# Patient Record
Sex: Female | Born: 1955 | Race: White | Hispanic: No | Smoking: Never smoker
Health system: Southern US, Community
[De-identification: ages and names within clinical notes are randomized; demographics above are authoritative.]

## PROBLEM LIST (undated history)

## (undated) DIAGNOSIS — J45909 Unspecified asthma, uncomplicated: Secondary | ICD-10-CM

## (undated) HISTORY — PX: SINOSCOPY: SHX187

## (undated) HISTORY — DX: Unspecified asthma, uncomplicated: J45.909

---

## 2014-12-08 DIAGNOSIS — Z8709 Personal history of other diseases of the respiratory system: Secondary | ICD-10-CM | POA: Insufficient documentation

## 2014-12-08 DIAGNOSIS — R0600 Dyspnea, unspecified: Secondary | ICD-10-CM

## 2014-12-08 DIAGNOSIS — Z87898 Personal history of other specified conditions: Secondary | ICD-10-CM | POA: Insufficient documentation

## 2014-12-08 DIAGNOSIS — J329 Chronic sinusitis, unspecified: Secondary | ICD-10-CM

## 2014-12-08 DIAGNOSIS — J3089 Other allergic rhinitis: Secondary | ICD-10-CM | POA: Insufficient documentation

## 2014-12-25 ENCOUNTER — Ambulatory Visit (INDEPENDENT_AMBULATORY_CARE_PROVIDER_SITE_OTHER): Payer: BLUE CROSS/BLUE SHIELD

## 2014-12-25 DIAGNOSIS — J329 Chronic sinusitis, unspecified: Secondary | ICD-10-CM

## 2014-12-25 DIAGNOSIS — J310 Chronic rhinitis: Secondary | ICD-10-CM

## 2015-01-01 ENCOUNTER — Ambulatory Visit (INDEPENDENT_AMBULATORY_CARE_PROVIDER_SITE_OTHER): Payer: BLUE CROSS/BLUE SHIELD

## 2015-01-01 DIAGNOSIS — J309 Allergic rhinitis, unspecified: Secondary | ICD-10-CM | POA: Diagnosis not present

## 2015-01-07 ENCOUNTER — Ambulatory Visit (INDEPENDENT_AMBULATORY_CARE_PROVIDER_SITE_OTHER): Payer: BLUE CROSS/BLUE SHIELD | Admitting: Neurology

## 2015-01-07 DIAGNOSIS — J309 Allergic rhinitis, unspecified: Secondary | ICD-10-CM

## 2015-01-15 ENCOUNTER — Ambulatory Visit (INDEPENDENT_AMBULATORY_CARE_PROVIDER_SITE_OTHER): Payer: BLUE CROSS/BLUE SHIELD

## 2015-01-15 DIAGNOSIS — J309 Allergic rhinitis, unspecified: Secondary | ICD-10-CM

## 2015-01-22 ENCOUNTER — Ambulatory Visit (INDEPENDENT_AMBULATORY_CARE_PROVIDER_SITE_OTHER): Payer: BLUE CROSS/BLUE SHIELD

## 2015-01-22 DIAGNOSIS — J309 Allergic rhinitis, unspecified: Secondary | ICD-10-CM | POA: Diagnosis not present

## 2015-01-29 ENCOUNTER — Ambulatory Visit (INDEPENDENT_AMBULATORY_CARE_PROVIDER_SITE_OTHER): Payer: BLUE CROSS/BLUE SHIELD

## 2015-01-29 DIAGNOSIS — J309 Allergic rhinitis, unspecified: Secondary | ICD-10-CM | POA: Diagnosis not present

## 2015-02-05 ENCOUNTER — Ambulatory Visit (INDEPENDENT_AMBULATORY_CARE_PROVIDER_SITE_OTHER): Payer: BLUE CROSS/BLUE SHIELD

## 2015-02-05 DIAGNOSIS — J309 Allergic rhinitis, unspecified: Secondary | ICD-10-CM

## 2015-02-07 DIAGNOSIS — J301 Allergic rhinitis due to pollen: Secondary | ICD-10-CM | POA: Diagnosis not present

## 2015-02-08 DIAGNOSIS — J3089 Other allergic rhinitis: Secondary | ICD-10-CM | POA: Diagnosis not present

## 2015-02-14 ENCOUNTER — Ambulatory Visit (INDEPENDENT_AMBULATORY_CARE_PROVIDER_SITE_OTHER): Payer: BLUE CROSS/BLUE SHIELD

## 2015-02-14 DIAGNOSIS — J309 Allergic rhinitis, unspecified: Secondary | ICD-10-CM

## 2015-02-26 ENCOUNTER — Ambulatory Visit (INDEPENDENT_AMBULATORY_CARE_PROVIDER_SITE_OTHER): Payer: BLUE CROSS/BLUE SHIELD

## 2015-02-26 DIAGNOSIS — J309 Allergic rhinitis, unspecified: Secondary | ICD-10-CM

## 2015-03-05 ENCOUNTER — Ambulatory Visit (INDEPENDENT_AMBULATORY_CARE_PROVIDER_SITE_OTHER): Payer: BLUE CROSS/BLUE SHIELD

## 2015-03-05 DIAGNOSIS — J309 Allergic rhinitis, unspecified: Secondary | ICD-10-CM | POA: Diagnosis not present

## 2015-03-15 ENCOUNTER — Ambulatory Visit (INDEPENDENT_AMBULATORY_CARE_PROVIDER_SITE_OTHER): Payer: BLUE CROSS/BLUE SHIELD | Admitting: *Deleted

## 2015-03-15 DIAGNOSIS — J309 Allergic rhinitis, unspecified: Secondary | ICD-10-CM | POA: Diagnosis not present

## 2015-03-28 ENCOUNTER — Ambulatory Visit (INDEPENDENT_AMBULATORY_CARE_PROVIDER_SITE_OTHER): Payer: BLUE CROSS/BLUE SHIELD

## 2015-03-28 DIAGNOSIS — J309 Allergic rhinitis, unspecified: Secondary | ICD-10-CM | POA: Diagnosis not present

## 2015-04-04 ENCOUNTER — Ambulatory Visit (INDEPENDENT_AMBULATORY_CARE_PROVIDER_SITE_OTHER): Payer: BLUE CROSS/BLUE SHIELD

## 2015-04-04 DIAGNOSIS — J309 Allergic rhinitis, unspecified: Secondary | ICD-10-CM

## 2015-04-16 ENCOUNTER — Ambulatory Visit (INDEPENDENT_AMBULATORY_CARE_PROVIDER_SITE_OTHER): Payer: BLUE CROSS/BLUE SHIELD

## 2015-04-16 DIAGNOSIS — J309 Allergic rhinitis, unspecified: Secondary | ICD-10-CM

## 2015-04-30 ENCOUNTER — Telehealth: Payer: Self-pay

## 2015-04-30 ENCOUNTER — Ambulatory Visit (INDEPENDENT_AMBULATORY_CARE_PROVIDER_SITE_OTHER): Payer: BLUE CROSS/BLUE SHIELD

## 2015-04-30 DIAGNOSIS — J309 Allergic rhinitis, unspecified: Secondary | ICD-10-CM | POA: Diagnosis not present

## 2015-04-30 NOTE — Telephone Encounter (Signed)
She was wondering if you knew a doctor within the [facility] or anywhere in [place] that you would highly recommend that is a ENT who specialies in otolaryngology?

## 2015-04-30 NOTE — Telephone Encounter (Signed)
Dr. Ezzard Standing or Dr. Haroldine Laws with [facility].

## 2015-05-01 NOTE — Telephone Encounter (Signed)
Patient need a physician specializes in sinuses more than an ENT. Patient can go outside from [facility]. Patient is asking you and her PCP.

## 2015-05-01 NOTE — Telephone Encounter (Signed)
Called and left voicemail for patient to return phone call

## 2015-05-01 NOTE — Telephone Encounter (Signed)
Spoke with patient and notified patient.

## 2015-05-01 NOTE — Telephone Encounter (Signed)
ENT's specialize in sinuses.

## 2015-05-14 ENCOUNTER — Ambulatory Visit (INDEPENDENT_AMBULATORY_CARE_PROVIDER_SITE_OTHER): Payer: BLUE CROSS/BLUE SHIELD

## 2015-05-14 DIAGNOSIS — J309 Allergic rhinitis, unspecified: Secondary | ICD-10-CM

## 2015-05-28 ENCOUNTER — Ambulatory Visit (INDEPENDENT_AMBULATORY_CARE_PROVIDER_SITE_OTHER): Payer: BLUE CROSS/BLUE SHIELD

## 2015-05-28 DIAGNOSIS — J309 Allergic rhinitis, unspecified: Secondary | ICD-10-CM | POA: Diagnosis not present

## 2015-05-30 DIAGNOSIS — J301 Allergic rhinitis due to pollen: Secondary | ICD-10-CM | POA: Diagnosis not present

## 2015-05-31 DIAGNOSIS — J3089 Other allergic rhinitis: Secondary | ICD-10-CM | POA: Diagnosis not present

## 2015-06-12 ENCOUNTER — Ambulatory Visit (INDEPENDENT_AMBULATORY_CARE_PROVIDER_SITE_OTHER): Payer: BLUE CROSS/BLUE SHIELD

## 2015-06-12 DIAGNOSIS — J309 Allergic rhinitis, unspecified: Secondary | ICD-10-CM

## 2015-06-25 ENCOUNTER — Ambulatory Visit (INDEPENDENT_AMBULATORY_CARE_PROVIDER_SITE_OTHER): Payer: BLUE CROSS/BLUE SHIELD

## 2015-06-25 DIAGNOSIS — J309 Allergic rhinitis, unspecified: Secondary | ICD-10-CM | POA: Diagnosis not present

## 2015-07-04 ENCOUNTER — Ambulatory Visit (INDEPENDENT_AMBULATORY_CARE_PROVIDER_SITE_OTHER): Payer: BLUE CROSS/BLUE SHIELD

## 2015-07-04 DIAGNOSIS — J309 Allergic rhinitis, unspecified: Secondary | ICD-10-CM | POA: Diagnosis not present

## 2015-07-15 ENCOUNTER — Ambulatory Visit (INDEPENDENT_AMBULATORY_CARE_PROVIDER_SITE_OTHER): Payer: BLUE CROSS/BLUE SHIELD

## 2015-07-15 DIAGNOSIS — J309 Allergic rhinitis, unspecified: Secondary | ICD-10-CM | POA: Diagnosis not present

## 2015-07-23 ENCOUNTER — Ambulatory Visit (INDEPENDENT_AMBULATORY_CARE_PROVIDER_SITE_OTHER): Payer: BLUE CROSS/BLUE SHIELD

## 2015-07-23 DIAGNOSIS — J309 Allergic rhinitis, unspecified: Secondary | ICD-10-CM

## 2015-08-01 ENCOUNTER — Ambulatory Visit (INDEPENDENT_AMBULATORY_CARE_PROVIDER_SITE_OTHER): Payer: BLUE CROSS/BLUE SHIELD

## 2015-08-01 DIAGNOSIS — J309 Allergic rhinitis, unspecified: Secondary | ICD-10-CM

## 2015-08-15 ENCOUNTER — Ambulatory Visit (INDEPENDENT_AMBULATORY_CARE_PROVIDER_SITE_OTHER): Payer: BLUE CROSS/BLUE SHIELD

## 2015-08-15 DIAGNOSIS — J309 Allergic rhinitis, unspecified: Secondary | ICD-10-CM

## 2015-08-27 ENCOUNTER — Ambulatory Visit (INDEPENDENT_AMBULATORY_CARE_PROVIDER_SITE_OTHER): Payer: BLUE CROSS/BLUE SHIELD | Admitting: *Deleted

## 2015-08-27 DIAGNOSIS — J309 Allergic rhinitis, unspecified: Secondary | ICD-10-CM

## 2015-09-18 ENCOUNTER — Ambulatory Visit: Payer: Self-pay | Admitting: *Deleted

## 2015-09-23 ENCOUNTER — Ambulatory Visit (INDEPENDENT_AMBULATORY_CARE_PROVIDER_SITE_OTHER): Payer: BLUE CROSS/BLUE SHIELD | Admitting: *Deleted

## 2015-09-23 DIAGNOSIS — J309 Allergic rhinitis, unspecified: Secondary | ICD-10-CM | POA: Diagnosis not present

## 2015-10-08 DIAGNOSIS — J301 Allergic rhinitis due to pollen: Secondary | ICD-10-CM | POA: Diagnosis not present

## 2015-10-09 DIAGNOSIS — J3089 Other allergic rhinitis: Secondary | ICD-10-CM | POA: Diagnosis not present

## 2015-10-15 ENCOUNTER — Ambulatory Visit (INDEPENDENT_AMBULATORY_CARE_PROVIDER_SITE_OTHER): Payer: BLUE CROSS/BLUE SHIELD | Admitting: *Deleted

## 2015-10-15 DIAGNOSIS — J309 Allergic rhinitis, unspecified: Secondary | ICD-10-CM

## 2015-10-22 ENCOUNTER — Ambulatory Visit (INDEPENDENT_AMBULATORY_CARE_PROVIDER_SITE_OTHER): Payer: BLUE CROSS/BLUE SHIELD | Admitting: Allergy and Immunology

## 2015-10-22 ENCOUNTER — Encounter: Payer: Self-pay | Admitting: Allergy and Immunology

## 2015-10-22 ENCOUNTER — Ambulatory Visit: Payer: Self-pay | Admitting: Allergy and Immunology

## 2015-10-22 VITALS — BP 110/78 | HR 68 | Temp 97.9°F | Resp 16 | Ht 64.0 in | Wt 136.6 lb

## 2015-10-22 DIAGNOSIS — Z8709 Personal history of other diseases of the respiratory system: Secondary | ICD-10-CM | POA: Diagnosis not present

## 2015-10-22 DIAGNOSIS — J3089 Other allergic rhinitis: Secondary | ICD-10-CM

## 2015-10-22 DIAGNOSIS — Z87898 Personal history of other specified conditions: Secondary | ICD-10-CM

## 2015-10-22 MED ORDER — LEVOCETIRIZINE DIHYDROCHLORIDE 5 MG PO TABS: 5.0000 mg | ORAL_TABLET | Freq: Every evening | ORAL | 5 refills | Status: DC

## 2015-10-22 MED ORDER — EPINEPHRINE 0.3 MG/0.3ML IJ SOAJ: INTRAMUSCULAR | 3 refills | Status: DC

## 2015-10-22 NOTE — Assessment & Plan Note (Signed)
   Continue appropriate allergen avoidance measures, aeroallergen immunotherapy as prescribed and as tolerated, and budesonide/saline irrigation as needed.  A prescription has been provided for levocetirizine, 5 mg daily as needed.

## 2015-10-22 NOTE — Progress Notes (Signed)
Follow-up Note  RE: Kirsten Huynh MRN: [id number] DOB: 1956/02/05 Date of Office Visit: 10/22/2015  Primary care provider: Gaspar Skeeters, MD Referring provider: No ref. provider found  History of present illness: Kirsten Huynh is a 60 y.o. female with allergic rhinitis on immunotherapy and history of dyspnea presenting today for follow up.  She was last seen in this clinic in July 2016.  She reports that her nasal/sinus symptoms have improved.  Her only complaint today has to do with a "whistling" noise which occurs when breathing through her nose since her sinus surgery/turbinate reduction in March 2016.  She is tolerating aeroallergen immunotherapy without complications or problems.  She continues to use budesonide/saline sinus irrigation as needed.  She has no lower respiratory symptom complaints today.    Assessment and plan: Allergic rhinitis  Continue appropriate allergen avoidance measures, aeroallergen immunotherapy as prescribed and as tolerated, and budesonide/saline irrigation as needed.  A prescription has been provided for levocetirizine, 5 mg daily as needed.  History of dyspnea Quiescent.  Unless lower respiratory symptoms recur, we will not treat or evaluate further.   Meds ordered this encounter  Medications  . EPINEPHrine (AUVI-Q) 0.3 mg/0.3 mL IJ SOAJ injection    Sig: USE AS DIRECTED FOR LIFE THREATENING ALLERGIC REACTIONS    Dispense:  2 Device    Refill:  3  . levocetirizine (XYZAL) 5 MG tablet    Sig: Take 1 tablet (5 mg total) by mouth every evening.    Dispense:  30 tablet    Refill:  5    Diagnositics: Spirometry:  Normal with an FEV1 of 94% predicted.  Please see scanned spirometry results for details.    Physical examination: Blood pressure 110/78, pulse 68, temperature 97.9 F (36.6 C), temperature source Oral, resp. rate 16, height 5\' 4"  (1.626 m), weight 136 lb 9.6 oz (62 kg), SpO2 98 %.  General: Alert, interactive, in no acute  distress. HEENT: TMs pearly gray, turbinates mildly edematous without discharge, post-pharynx mildly erythematous. Neck: Supple without lymphadenopathy. Lungs: Clear to auscultation without wheezing, rhonchi or rales. CV: Normal S1, S2 without murmurs. Skin: Warm and dry, without lesions or rashes.  The following portions of the patient's history were reviewed and updated as appropriate: allergies, current medications, past family history, past medical history, past social history, past surgical history and problem list.    Medication List       Accurate as of 10/22/15  1:36 PM. Always use your most recent med list.          alendronate 70 MG tablet Commonly known as:  FOSAMAX Take by mouth.   budesonide 0.5 MG/2ML nebulizer solution Commonly known as:  PULMICORT MIX WITH SALINE FOR NASAL RINSE ONCE DAILY IF NEEDED   EPINEPHrine 0.3 mg/0.3 mL Soaj injection Commonly known as:  AUVI-Q USE AS DIRECTED FOR LIFE THREATENING ALLERGIC REACTIONS   GUAIFENESIN 1200 PO Take 1,200 mg by mouth 2 (two) times daily.   levocetirizine 5 MG tablet Commonly known as:  XYZAL Take 1 tablet (5 mg total) by mouth every evening.   NASAL SALINE NA Place into the nose as needed.   PROAIR RESPICLICK 108 (90 Base) MCG/ACT Aepb Generic drug:  Albuterol Sulfate Inhale 2 puffs into the lungs as needed.   Vitamin D (Ergocalciferol) 50000 units Caps capsule Commonly known as:  DRISDOL Take 50,000 Units by mouth once a week.   Vitamin D (Ergocalciferol) 50000 units Caps capsule Commonly known as:  DRISDOL Take by mouth.  Allergies  Allergen Reactions  . Sudafed [Pseudoephedrine Hcl]     I appreciate the opportunity to take part in Rhilee's care. Please do not hesitate to contact me with questions.  Sincerely,   R. Jorene Guest, MD

## 2015-10-22 NOTE — Patient Instructions (Signed)
Allergic rhinitis  Continue appropriate allergen avoidance measures, aeroallergen immunotherapy as prescribed and as tolerated, and budesonide/saline irrigation as needed.  A prescription has been provided for levocetirizine, 5 mg daily as needed.  History of dyspnea Quiescent.  Unless lower respiratory symptoms recur, we will not treat or evaluate further.   Return in about 1 year (around 10/21/2016), or if symptoms worsen or fail to improve.

## 2015-10-22 NOTE — Assessment & Plan Note (Signed)
Quiescent.  Unless lower respiratory symptoms recur, we will not treat or evaluate further. 

## 2015-11-04 ENCOUNTER — Ambulatory Visit (INDEPENDENT_AMBULATORY_CARE_PROVIDER_SITE_OTHER): Payer: BLUE CROSS/BLUE SHIELD | Admitting: *Deleted

## 2015-11-04 DIAGNOSIS — J309 Allergic rhinitis, unspecified: Secondary | ICD-10-CM

## 2015-11-19 ENCOUNTER — Ambulatory Visit (INDEPENDENT_AMBULATORY_CARE_PROVIDER_SITE_OTHER): Payer: BLUE CROSS/BLUE SHIELD | Admitting: *Deleted

## 2015-11-19 DIAGNOSIS — J309 Allergic rhinitis, unspecified: Secondary | ICD-10-CM | POA: Diagnosis not present

## 2015-12-03 ENCOUNTER — Ambulatory Visit (INDEPENDENT_AMBULATORY_CARE_PROVIDER_SITE_OTHER): Payer: BLUE CROSS/BLUE SHIELD

## 2015-12-03 DIAGNOSIS — J309 Allergic rhinitis, unspecified: Secondary | ICD-10-CM

## 2015-12-10 ENCOUNTER — Ambulatory Visit (INDEPENDENT_AMBULATORY_CARE_PROVIDER_SITE_OTHER): Payer: BLUE CROSS/BLUE SHIELD | Admitting: *Deleted

## 2015-12-10 DIAGNOSIS — J309 Allergic rhinitis, unspecified: Secondary | ICD-10-CM | POA: Diagnosis not present

## 2015-12-17 ENCOUNTER — Ambulatory Visit (INDEPENDENT_AMBULATORY_CARE_PROVIDER_SITE_OTHER): Payer: BLUE CROSS/BLUE SHIELD

## 2015-12-17 DIAGNOSIS — J309 Allergic rhinitis, unspecified: Secondary | ICD-10-CM

## 2015-12-24 ENCOUNTER — Ambulatory Visit (INDEPENDENT_AMBULATORY_CARE_PROVIDER_SITE_OTHER): Payer: BLUE CROSS/BLUE SHIELD

## 2015-12-24 DIAGNOSIS — J309 Allergic rhinitis, unspecified: Secondary | ICD-10-CM

## 2015-12-31 ENCOUNTER — Ambulatory Visit (INDEPENDENT_AMBULATORY_CARE_PROVIDER_SITE_OTHER): Payer: BLUE CROSS/BLUE SHIELD

## 2015-12-31 DIAGNOSIS — J309 Allergic rhinitis, unspecified: Secondary | ICD-10-CM

## 2016-01-21 ENCOUNTER — Ambulatory Visit (INDEPENDENT_AMBULATORY_CARE_PROVIDER_SITE_OTHER): Payer: BLUE CROSS/BLUE SHIELD

## 2016-01-21 DIAGNOSIS — J309 Allergic rhinitis, unspecified: Secondary | ICD-10-CM

## 2016-02-17 ENCOUNTER — Ambulatory Visit (INDEPENDENT_AMBULATORY_CARE_PROVIDER_SITE_OTHER): Payer: BLUE CROSS/BLUE SHIELD | Admitting: *Deleted

## 2016-02-17 DIAGNOSIS — J309 Allergic rhinitis, unspecified: Secondary | ICD-10-CM

## 2016-03-10 ENCOUNTER — Ambulatory Visit (INDEPENDENT_AMBULATORY_CARE_PROVIDER_SITE_OTHER): Payer: BLUE CROSS/BLUE SHIELD | Admitting: *Deleted

## 2016-03-10 DIAGNOSIS — J309 Allergic rhinitis, unspecified: Secondary | ICD-10-CM

## 2016-03-31 ENCOUNTER — Ambulatory Visit (INDEPENDENT_AMBULATORY_CARE_PROVIDER_SITE_OTHER): Payer: BLUE CROSS/BLUE SHIELD | Admitting: *Deleted

## 2016-03-31 DIAGNOSIS — J309 Allergic rhinitis, unspecified: Secondary | ICD-10-CM | POA: Diagnosis not present

## 2016-04-09 DIAGNOSIS — J301 Allergic rhinitis due to pollen: Secondary | ICD-10-CM | POA: Diagnosis not present

## 2016-04-10 DIAGNOSIS — J3089 Other allergic rhinitis: Secondary | ICD-10-CM | POA: Diagnosis not present

## 2016-04-17 NOTE — Addendum Note (Signed)
Addended by: Maryjean MornFREEMAN, LOGAN D on: 04/17/2016 01:33 PM   Modules accepted: Orders

## 2016-04-23 ENCOUNTER — Ambulatory Visit (INDEPENDENT_AMBULATORY_CARE_PROVIDER_SITE_OTHER): Payer: BLUE CROSS/BLUE SHIELD

## 2016-04-23 DIAGNOSIS — J309 Allergic rhinitis, unspecified: Secondary | ICD-10-CM

## 2016-05-18 ENCOUNTER — Ambulatory Visit (INDEPENDENT_AMBULATORY_CARE_PROVIDER_SITE_OTHER): Payer: BLUE CROSS/BLUE SHIELD

## 2016-05-18 DIAGNOSIS — J309 Allergic rhinitis, unspecified: Secondary | ICD-10-CM | POA: Diagnosis not present

## 2016-05-26 ENCOUNTER — Ambulatory Visit (INDEPENDENT_AMBULATORY_CARE_PROVIDER_SITE_OTHER): Payer: BLUE CROSS/BLUE SHIELD | Admitting: *Deleted

## 2016-05-26 DIAGNOSIS — J309 Allergic rhinitis, unspecified: Secondary | ICD-10-CM

## 2016-06-02 ENCOUNTER — Ambulatory Visit (INDEPENDENT_AMBULATORY_CARE_PROVIDER_SITE_OTHER): Payer: BLUE CROSS/BLUE SHIELD | Admitting: *Deleted

## 2016-06-02 DIAGNOSIS — J309 Allergic rhinitis, unspecified: Secondary | ICD-10-CM | POA: Diagnosis not present

## 2016-06-09 ENCOUNTER — Ambulatory Visit (INDEPENDENT_AMBULATORY_CARE_PROVIDER_SITE_OTHER): Payer: BLUE CROSS/BLUE SHIELD | Admitting: *Deleted

## 2016-06-09 DIAGNOSIS — J309 Allergic rhinitis, unspecified: Secondary | ICD-10-CM

## 2016-06-17 ENCOUNTER — Ambulatory Visit (INDEPENDENT_AMBULATORY_CARE_PROVIDER_SITE_OTHER): Payer: BLUE CROSS/BLUE SHIELD | Admitting: *Deleted

## 2016-06-17 DIAGNOSIS — J309 Allergic rhinitis, unspecified: Secondary | ICD-10-CM

## 2016-07-14 ENCOUNTER — Ambulatory Visit (INDEPENDENT_AMBULATORY_CARE_PROVIDER_SITE_OTHER): Payer: BLUE CROSS/BLUE SHIELD | Admitting: *Deleted

## 2016-07-14 DIAGNOSIS — J309 Allergic rhinitis, unspecified: Secondary | ICD-10-CM

## 2016-08-06 ENCOUNTER — Ambulatory Visit (INDEPENDENT_AMBULATORY_CARE_PROVIDER_SITE_OTHER): Payer: BLUE CROSS/BLUE SHIELD | Admitting: *Deleted

## 2016-08-06 DIAGNOSIS — J309 Allergic rhinitis, unspecified: Secondary | ICD-10-CM | POA: Diagnosis not present

## 2016-08-25 ENCOUNTER — Ambulatory Visit (INDEPENDENT_AMBULATORY_CARE_PROVIDER_SITE_OTHER): Payer: BLUE CROSS/BLUE SHIELD

## 2016-08-25 DIAGNOSIS — J309 Allergic rhinitis, unspecified: Secondary | ICD-10-CM | POA: Diagnosis not present

## 2016-08-28 DIAGNOSIS — J3089 Other allergic rhinitis: Secondary | ICD-10-CM | POA: Diagnosis not present

## 2016-09-17 ENCOUNTER — Ambulatory Visit (INDEPENDENT_AMBULATORY_CARE_PROVIDER_SITE_OTHER): Payer: BLUE CROSS/BLUE SHIELD

## 2016-09-17 DIAGNOSIS — J309 Allergic rhinitis, unspecified: Secondary | ICD-10-CM

## 2016-10-14 ENCOUNTER — Ambulatory Visit (INDEPENDENT_AMBULATORY_CARE_PROVIDER_SITE_OTHER): Payer: BLUE CROSS/BLUE SHIELD

## 2016-10-14 DIAGNOSIS — J309 Allergic rhinitis, unspecified: Secondary | ICD-10-CM

## 2016-10-20 ENCOUNTER — Ambulatory Visit (INDEPENDENT_AMBULATORY_CARE_PROVIDER_SITE_OTHER): Payer: BLUE CROSS/BLUE SHIELD | Admitting: Allergy and Immunology

## 2016-10-20 ENCOUNTER — Encounter: Payer: Self-pay | Admitting: Allergy and Immunology

## 2016-10-20 VITALS — BP 98/54 | HR 60 | Temp 97.7°F | Resp 16

## 2016-10-20 DIAGNOSIS — J3089 Other allergic rhinitis: Secondary | ICD-10-CM

## 2016-10-20 DIAGNOSIS — Z8709 Personal history of other diseases of the respiratory system: Secondary | ICD-10-CM | POA: Diagnosis not present

## 2016-10-20 DIAGNOSIS — Z87898 Personal history of other specified conditions: Secondary | ICD-10-CM

## 2016-10-20 DIAGNOSIS — J321 Chronic frontal sinusitis: Secondary | ICD-10-CM | POA: Diagnosis not present

## 2016-10-20 DIAGNOSIS — J329 Chronic sinusitis, unspecified: Secondary | ICD-10-CM | POA: Insufficient documentation

## 2016-10-20 MED ORDER — EPINEPHRINE 0.3 MG/0.3ML IJ SOAJ: INTRAMUSCULAR | 1 refills | Status: DC

## 2016-10-20 MED ORDER — IPRATROPIUM BROMIDE 0.06 % NA SOLN: 2.0000 | Freq: Three times a day (TID) | NASAL | 12 refills | Status: DC | PRN

## 2016-10-20 NOTE — Progress Notes (Signed)
Follow-up Note  RE: Kirsten BoatmanRhonda Huynh MRN: [id number] DOB: 03-18-1956 Date of Office Visit: 10/20/2016  Primary care provider: Gaspar SkeetersStambaugh, Merris, MD Referring provider: Gaspar SkeetersStambaugh, Merris, MD  History of present illness: Kirsten BoatmanRhonda Huynh is a 61 y.o. female with allergic rhinitis and history of dyspnea presenting today for follow up.  She was last seen in this clinic in July 2017.  Despite compliance with budesonide saline rinses twice a day, guaifenesin, and aeroallergen immunotherapy, she still complains of thick postnasal drainage, nasal congestion, and persistent mild/moderate sinus pressure.  She has tried and failed azelastine nasal spray and fluticasone nasal spray in the past.  She states that she did not experienced symptom reduction from sinus surgery in March 2016 and believes that her symptoms may have progressed due to scar tissue.  She is tolerating aeroallergen immunotherapy injections without problems or complications. She does not experience wheezing or chest tightness.   Assessment and plan: Chronic sinusitis  Continue budesonide saline irrigation twice a day.  A prescription has been provided for ipratropium 0.06% nasal spray every 8 hours as needed.  A prescription has been provided for RyVent (carbinoxamine maleate) 6mg  every 6-8 hours as needed.  Referral to Dr. Suszanne Connerseoh, otolaryngology, has been provided for a second opinion.  Allergic rhinitis  Continue appropriate allergen avoidance measures and aeroallergen immunotherapy as prescribed and as tolerated.  Treatment plan as outlined above for chronic sinusitis.  History of dyspnea Quiescent.  Unless lower respiratory symptoms recur, we will not treat or evaluate further.   Meds ordered this encounter  Medications  . EPINEPHrine (AUVI-Q) 0.3 mg/0.3 mL IJ SOAJ injection    Sig: USE AS DIRECTED FOR LIFE THREATENING ALLERGIC REACTIONS    Dispense:  2 Device    Refill:  1    [phone]  . ipratropium (ATROVENT) 0.06  % nasal spray    Sig: Place 2 sprays into both nostrils every 8 (eight) hours as needed for rhinitis.    Dispense:  15 mL    Refill:  12    Physical examination: Blood pressure (!) 98/54, pulse 60, temperature 97.7 F (36.5 C), temperature source Oral, resp. rate 16, SpO2 98 %.  General: Alert, interactive, in no acute distress. HEENT: TMs pearly gray, turbinates edematous with thick discharge, post-pharynx erythematous. Neck: Supple without lymphadenopathy. Lungs: Clear to auscultation without wheezing, rhonchi or rales. CV: Normal S1, S2 without murmurs. Skin: Warm and dry, without lesions or rashes.  The following portions of the patient's history were reviewed and updated as appropriate: allergies, current medications, past family history, past medical history, past social history, past surgical history and problem list.   Allergies as of 10/20/2016      Reactions   Sudafed [pseudoephedrine Hcl]       Medication List       Accurate as of 10/20/16  1:07 PM. Always use your most recent med list.          alendronate 70 MG tablet Commonly known as:  FOSAMAX Take by mouth.   budesonide 0.5 MG/2ML nebulizer solution Commonly known as:  PULMICORT MIX WITH SALINE FOR NASAL RINSE ONCE DAILY IF NEEDED   EPINEPHrine 0.3 mg/0.3 mL Soaj injection Commonly known as:  AUVI-Q USE AS DIRECTED FOR LIFE THREATENING ALLERGIC REACTIONS   GUAIFENESIN 1200 PO Take 1,200 mg by mouth 2 (two) times daily.   ipratropium 0.06 % nasal spray Commonly known as:  ATROVENT Place 2 sprays into both nostrils every 8 (eight) hours as needed for rhinitis.  levocetirizine 5 MG tablet Commonly known as:  XYZAL Take 1 tablet (5 mg total) by mouth every evening.   NASAL SALINE NA Place into the nose as needed.   PROAIR RESPICLICK 108 (90 Base) MCG/ACT Aepb Generic drug:  Albuterol Sulfate Inhale 2 puffs into the lungs as needed.   vitamin C 500 MG tablet Commonly known as:  ASCORBIC  ACID Take 500 mg by mouth daily.   Vitamin D (Ergocalciferol) 50000 units Caps capsule Commonly known as:  DRISDOL Take 50,000 Units by mouth once a week.   Vitamin D (Ergocalciferol) 50000 units Caps capsule Commonly known as:  DRISDOL Take by mouth.       Allergies  Allergen Reactions  . Sudafed [Pseudoephedrine Hcl]    Review of systems: Review of systems negative except as noted in HPI / PMHx or noted below: Constitutional: Negative.  HENT: Negative.   Eyes: Negative.  Respiratory: Negative.   Cardiovascular: Negative.  Gastrointestinal: Negative.  Genitourinary: Negative.  Musculoskeletal: Negative.  Neurological: Negative.  Endo/Heme/Allergies: Negative.  Cutaneous: Negative.  Past Medical History:  Diagnosis Date  . Asthma     Family History  Problem Relation Age of Onset  . Asthma Mother   . Allergic rhinitis Mother   . Allergic rhinitis Father   . Asthma Sister   . Allergic rhinitis Sister   . Asthma Brother   . Allergic rhinitis Brother   . Angioedema Neg Hx   . Eczema Neg Hx   . Immunodeficiency Neg Hx   . Urticaria Neg Hx     Social History   Social History  . Marital status: Unknown    Spouse name: N/A  . Number of children: N/A  . Years of education: N/A   Occupational History  . Not on file.   Social History Main Topics  . Smoking status: Never Smoker  . Smokeless tobacco: Never Used  . Alcohol use No  . Drug use: No  . Sexual activity: Not on file   Other Topics Concern  . Not on file   Social History Narrative  . No narrative on file    I appreciate the opportunity to take part in Kirsten Huynh's care. Please do not hesitate to contact me with questions.  Sincerely,   R. Jorene Guest, MD

## 2016-10-20 NOTE — Assessment & Plan Note (Signed)
   Continue budesonide saline irrigation twice a day.  A prescription has been provided for ipratropium 0.06% nasal spray every 8 hours as needed.  A prescription has been provided for RyVent (carbinoxamine maleate) 6mg  every 6-8 hours as needed.  Referral to Dr. Suszanne Connerseoh, otolaryngology, has been provided for a second opinion.

## 2016-10-20 NOTE — Assessment & Plan Note (Signed)
   Continue appropriate allergen avoidance measures and aeroallergen immunotherapy as prescribed and as tolerated.  Treatment plan as outlined above for chronic sinusitis.

## 2016-10-20 NOTE — Assessment & Plan Note (Signed)
Quiescent.  Unless lower respiratory symptoms recur, we will not treat or evaluate further. 

## 2016-10-20 NOTE — Patient Instructions (Addendum)
Chronic sinusitis  Continue budesonide saline irrigation twice a day.  A prescription has been provided for ipratropium 0.06% nasal spray every 8 hours as needed.  A prescription has been provided for RyVent (carbinoxamine maleate) 6mg  every 6-8 hours as needed.  Referral to Dr. Suszanne Connerseoh, otolaryngology, has been provided for a second opinion.  Allergic rhinitis  Continue appropriate allergen avoidance measures and aeroallergen immunotherapy as prescribed and as tolerated.  Treatment plan as outlined above for chronic sinusitis.  History of dyspnea Quiescent.  Unless lower respiratory symptoms recur, we will not treat or evaluate further.   Return in about 6 months (around 04/22/2017), or if symptoms worsen or fail to improve.

## 2016-10-27 ENCOUNTER — Telehealth: Payer: Self-pay

## 2016-10-27 NOTE — Telephone Encounter (Signed)
-----   Message from Cristal Fordalph Carter Bobbitt, MD sent at 10/21/2016 11:48 AM EDT ----- Chronic sinusitis.  ----- Message ----- From: Agapito GamesNeal-Mims, Charvis Lightner A, NT Sent: 10/21/2016   9:21 AM To: Cristal Fordalph Carter Bobbitt, MD  Hey,  What is the patients Diagnosis for Dr. Suszanne Connerseoh.  Thanks  ----- Message ----- From: Cristal FordBobbitt, Ralph Carter, MD Sent: 10/20/2016  12:11 PM To: Marin Robertsyasia A Neal-Mims, NT  Please refer to Dr. Suszanne Connerseoh, otolaryngology. She has [facility] an ENT [facility] but wants a second opinion. 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 Thanks

## 2016-10-27 NOTE — Telephone Encounter (Signed)
Marylen PontoMarie Faxed the referral to Dr. Luther Hearingeohs office.   Will follow up with Dr. Luther Hearingeohs office to see if they have scheduled the patient.  Thanks

## 2016-10-28 ENCOUNTER — Other Ambulatory Visit (HOSPITAL_COMMUNITY): Payer: Self-pay | Admitting: Family Medicine

## 2016-10-28 DIAGNOSIS — J321 Chronic frontal sinusitis: Secondary | ICD-10-CM

## 2016-11-03 ENCOUNTER — Ambulatory Visit (HOSPITAL_COMMUNITY)
Admission: RE | Admit: 2016-11-03 | Discharge: 2016-11-03 | Disposition: A | Discharge status: Home / Self Care | Payer: BLUE CROSS/BLUE SHIELD | Source: Ambulatory Visit | Attending: Family Medicine | Admitting: Family Medicine

## 2016-11-03 ENCOUNTER — Ambulatory Visit (INDEPENDENT_AMBULATORY_CARE_PROVIDER_SITE_OTHER): Payer: BLUE CROSS/BLUE SHIELD | Admitting: *Deleted

## 2016-11-03 DIAGNOSIS — J321 Chronic frontal sinusitis: Secondary | ICD-10-CM | POA: Diagnosis present

## 2016-11-03 DIAGNOSIS — Z9889 Other specified postprocedural states: Secondary | ICD-10-CM | POA: Diagnosis not present

## 2016-11-03 DIAGNOSIS — J309 Allergic rhinitis, unspecified: Secondary | ICD-10-CM | POA: Diagnosis not present

## 2016-11-03 DIAGNOSIS — J3489 Other specified disorders of nose and nasal sinuses: Secondary | ICD-10-CM | POA: Insufficient documentation

## 2016-11-09 ENCOUNTER — Ambulatory Visit (INDEPENDENT_AMBULATORY_CARE_PROVIDER_SITE_OTHER): Payer: BLUE CROSS/BLUE SHIELD

## 2016-11-09 DIAGNOSIS — J309 Allergic rhinitis, unspecified: Secondary | ICD-10-CM | POA: Diagnosis not present

## 2016-11-17 ENCOUNTER — Ambulatory Visit (INDEPENDENT_AMBULATORY_CARE_PROVIDER_SITE_OTHER): Payer: BLUE CROSS/BLUE SHIELD | Admitting: *Deleted

## 2016-11-17 DIAGNOSIS — J309 Allergic rhinitis, unspecified: Secondary | ICD-10-CM

## 2016-11-24 ENCOUNTER — Ambulatory Visit (INDEPENDENT_AMBULATORY_CARE_PROVIDER_SITE_OTHER): Payer: BLUE CROSS/BLUE SHIELD | Admitting: *Deleted

## 2016-11-24 DIAGNOSIS — J309 Allergic rhinitis, unspecified: Secondary | ICD-10-CM | POA: Diagnosis not present

## 2016-12-03 ENCOUNTER — Ambulatory Visit (INDEPENDENT_AMBULATORY_CARE_PROVIDER_SITE_OTHER): Payer: BLUE CROSS/BLUE SHIELD | Admitting: *Deleted

## 2016-12-03 DIAGNOSIS — J309 Allergic rhinitis, unspecified: Secondary | ICD-10-CM

## 2017-01-01 ENCOUNTER — Ambulatory Visit (INDEPENDENT_AMBULATORY_CARE_PROVIDER_SITE_OTHER): Payer: BLUE CROSS/BLUE SHIELD

## 2017-01-01 DIAGNOSIS — J309 Allergic rhinitis, unspecified: Secondary | ICD-10-CM | POA: Diagnosis not present

## 2017-02-02 ENCOUNTER — Ambulatory Visit (INDEPENDENT_AMBULATORY_CARE_PROVIDER_SITE_OTHER): Payer: BLUE CROSS/BLUE SHIELD

## 2017-02-02 DIAGNOSIS — J309 Allergic rhinitis, unspecified: Secondary | ICD-10-CM

## 2017-03-04 ENCOUNTER — Ambulatory Visit (INDEPENDENT_AMBULATORY_CARE_PROVIDER_SITE_OTHER): Payer: BLUE CROSS/BLUE SHIELD | Admitting: Allergy

## 2017-03-04 DIAGNOSIS — J309 Allergic rhinitis, unspecified: Secondary | ICD-10-CM

## 2017-03-17 ENCOUNTER — Encounter: Payer: Self-pay | Admitting: *Deleted

## 2017-03-17 DIAGNOSIS — J3089 Other allergic rhinitis: Secondary | ICD-10-CM | POA: Diagnosis not present

## 2017-03-17 NOTE — Progress Notes (Signed)
VIALS MADE. EXP: 03-17-18. HV 

## 2017-03-31 ENCOUNTER — Ambulatory Visit (INDEPENDENT_AMBULATORY_CARE_PROVIDER_SITE_OTHER): Payer: BLUE CROSS/BLUE SHIELD | Admitting: *Deleted

## 2017-03-31 DIAGNOSIS — J309 Allergic rhinitis, unspecified: Secondary | ICD-10-CM

## 2017-05-04 ENCOUNTER — Ambulatory Visit (INDEPENDENT_AMBULATORY_CARE_PROVIDER_SITE_OTHER): Payer: BLUE CROSS/BLUE SHIELD | Admitting: *Deleted

## 2017-05-04 DIAGNOSIS — J309 Allergic rhinitis, unspecified: Secondary | ICD-10-CM | POA: Diagnosis not present

## 2017-06-01 ENCOUNTER — Ambulatory Visit (INDEPENDENT_AMBULATORY_CARE_PROVIDER_SITE_OTHER): Payer: BLUE CROSS/BLUE SHIELD | Admitting: *Deleted

## 2017-06-01 DIAGNOSIS — J309 Allergic rhinitis, unspecified: Secondary | ICD-10-CM

## 2017-06-29 ENCOUNTER — Ambulatory Visit (INDEPENDENT_AMBULATORY_CARE_PROVIDER_SITE_OTHER): Payer: BLUE CROSS/BLUE SHIELD | Admitting: *Deleted

## 2017-06-29 DIAGNOSIS — J309 Allergic rhinitis, unspecified: Secondary | ICD-10-CM | POA: Diagnosis not present

## 2017-07-06 ENCOUNTER — Ambulatory Visit (INDEPENDENT_AMBULATORY_CARE_PROVIDER_SITE_OTHER): Payer: BLUE CROSS/BLUE SHIELD | Admitting: *Deleted

## 2017-07-06 DIAGNOSIS — J309 Allergic rhinitis, unspecified: Secondary | ICD-10-CM

## 2017-07-15 ENCOUNTER — Ambulatory Visit (INDEPENDENT_AMBULATORY_CARE_PROVIDER_SITE_OTHER): Payer: BLUE CROSS/BLUE SHIELD | Admitting: *Deleted

## 2017-07-15 DIAGNOSIS — J309 Allergic rhinitis, unspecified: Secondary | ICD-10-CM | POA: Diagnosis not present

## 2017-07-27 ENCOUNTER — Ambulatory Visit (INDEPENDENT_AMBULATORY_CARE_PROVIDER_SITE_OTHER): Payer: BLUE CROSS/BLUE SHIELD | Admitting: *Deleted

## 2017-07-27 DIAGNOSIS — J309 Allergic rhinitis, unspecified: Secondary | ICD-10-CM | POA: Diagnosis not present

## 2017-08-03 ENCOUNTER — Ambulatory Visit (INDEPENDENT_AMBULATORY_CARE_PROVIDER_SITE_OTHER): Payer: BLUE CROSS/BLUE SHIELD | Admitting: *Deleted

## 2017-08-03 DIAGNOSIS — J309 Allergic rhinitis, unspecified: Secondary | ICD-10-CM

## 2017-09-03 ENCOUNTER — Ambulatory Visit (INDEPENDENT_AMBULATORY_CARE_PROVIDER_SITE_OTHER): Payer: BLUE CROSS/BLUE SHIELD

## 2017-09-03 DIAGNOSIS — J309 Allergic rhinitis, unspecified: Secondary | ICD-10-CM | POA: Diagnosis not present

## 2017-10-05 ENCOUNTER — Ambulatory Visit (INDEPENDENT_AMBULATORY_CARE_PROVIDER_SITE_OTHER): Payer: BLUE CROSS/BLUE SHIELD | Admitting: *Deleted

## 2017-10-05 DIAGNOSIS — J309 Allergic rhinitis, unspecified: Secondary | ICD-10-CM | POA: Diagnosis not present

## 2017-10-12 ENCOUNTER — Encounter: Payer: Self-pay | Admitting: Allergy and Immunology

## 2017-10-12 ENCOUNTER — Ambulatory Visit (INDEPENDENT_AMBULATORY_CARE_PROVIDER_SITE_OTHER): Payer: BLUE CROSS/BLUE SHIELD | Admitting: Allergy and Immunology

## 2017-10-12 DIAGNOSIS — Z8709 Personal history of other diseases of the respiratory system: Secondary | ICD-10-CM

## 2017-10-12 DIAGNOSIS — J321 Chronic frontal sinusitis: Secondary | ICD-10-CM | POA: Diagnosis not present

## 2017-10-12 DIAGNOSIS — J3089 Other allergic rhinitis: Secondary | ICD-10-CM | POA: Diagnosis not present

## 2017-10-12 DIAGNOSIS — Z87898 Personal history of other specified conditions: Secondary | ICD-10-CM

## 2017-10-12 MED ORDER — EPINEPHRINE 0.3 MG/0.3ML IJ SOAJ: INTRAMUSCULAR | 1 refills | Status: DC

## 2017-10-12 NOTE — Assessment & Plan Note (Deleted)
   Treatment plan as outlined above for allergic rhinitis.  Follow-up with otolaryngologist if/when needed.

## 2017-10-12 NOTE — Progress Notes (Signed)
    Follow-up Note  RE: Nida BoatmanRhonda Kloehn MRN: [id number] DOB: [birth year] Date of Office Visit: 10/12/2017  Primary care provider: Gaspar SkeetersStambaugh, Merris, MD Referring provider: Gaspar SkeetersStambaugh, Merris, MD  History of present illness: Nida BoatmanRhonda Santa is a 62 y.o. female with allergic rhinitis presenting today for follow-up.  She was last seen in this clinic in July 2018.  She still experiences sinus pressure and ear pressure by compliance with prescribed medications and immunotherapy injections.  There has been some decrease in the frequency of sinus infections since starting immunotherapy injections.  She follows periodically with otolaryngologists.  Sinus surgery has been proposed, however she is not interested in pursuing surgical options at this time.  Assessment and plan: Allergic rhinitis  Continue appropriate allergen avoidance measures and aeroallergen immunotherapy as prescribed and as tolerated.  Continue budesonide saline irrigation twice a day and ipratropium 0.06% nasal spray every 8 hours if needed.  Chronic sinusitis  Treatment plan as outlined above for allergic rhinitis.  Follow-up with otolaryngologist if/when needed.  History of dyspnea Quiescent.  Unless lower respiratory symptoms recur, we will not treat or evaluate further.   Meds ordered this encounter  Medications  . EPINEPHrine (AUVI-Q) 0.3 mg/0.3 mL IJ SOAJ injection    Sig: USE AS DIRECTED FOR LIFE THREATENING ALLERGIC REACTIONS    Dispense:  2 Device    Refill:  1    [id number]    Physical examination: Blood pressure 100/62, pulse 70, temperature 98 F (36.7 C), temperature source Oral, resp. rate 20, height 5' 4.1" (1.628 m), weight 136 lb (61.7 kg), SpO2 94 %.  General: Alert, interactive, in no acute distress. HEENT: TMs pearly gray, turbinates moderately edematous without discharge, post-pharynx mildly erythematous. Neck: Supple without lymphadenopathy. Lungs: Clear to auscultation without wheezing, rhonchi  or rales. CV: Normal S1, S2 without murmurs. Skin: Warm and dry, without lesions or rashes.  The following portions of the patient's history were reviewed and updated as appropriate: allergies, current medications, past family history, past medical history, past social history, past surgical history and problem list.  Allergies as of 10/12/2017      Reactions   Sudafed [pseudoephedrine Hcl]       Medication List        Accurate as of 10/12/17  6:02 PM. Always use your most recent med list.          alendronate 70 MG tablet Commonly known as:  FOSAMAX Take by mouth.   budesonide 0.5 MG/2ML nebulizer solution Commonly known as:  PULMICORT MIX WITH SALINE FOR NASAL RINSE ONCE DAILY IF NEEDED   EPINEPHrine 0.3 mg/0.3 mL Soaj injection Commonly known as:  AUVI-Q USE AS DIRECTED FOR LIFE THREATENING ALLERGIC REACTIONS   GUAIFENESIN 1200 PO Take 1,200 mg by mouth 2 (two) times daily.   NASAL SALINE NA Place into the nose as needed.   vitamin C 500 MG tablet Commonly known as:  ASCORBIC ACID Take 500 mg by mouth daily.       Allergies  Allergen Reactions  . Sudafed [Pseudoephedrine Hcl]     I appreciate the opportunity to take part in Jahayra's care. Please do not hesitate to contact me with questions.  Sincerely,   R. Jorene Guestarter Delshon Blanchfield, MD

## 2017-10-12 NOTE — Patient Instructions (Addendum)
Allergic rhinitis  Continue appropriate allergen avoidance measures and aeroallergen immunotherapy as prescribed and as tolerated.  Continue budesonide saline irrigation twice a day and ipratropium 0.06% nasal spray every 8 hours if needed.  Chronic sinusitis  Treatment plan as outlined above for allergic rhinitis.  Follow-up with otolaryngologist if/when needed.  History of dyspnea Quiescent.  Unless lower respiratory symptoms recur, we will not treat or evaluate further.   Return in about 1 year (around 10/13/2018), or if symptoms worsen or fail to improve.

## 2017-10-12 NOTE — Assessment & Plan Note (Signed)
Quiescent.  Unless lower respiratory symptoms recur, we will not treat or evaluate further. 

## 2017-10-12 NOTE — Assessment & Plan Note (Signed)
   Treatment plan as outlined above for allergic rhinitis.  Follow-up with otolaryngologist if/when needed. 

## 2017-10-12 NOTE — Assessment & Plan Note (Signed)
   Continue appropriate allergen avoidance measures and aeroallergen immunotherapy as prescribed and as tolerated.  Continue budesonide saline irrigation twice a day and ipratropium 0.06% nasal spray every 8 hours if needed.

## 2017-10-12 NOTE — Assessment & Plan Note (Deleted)
Quiescent.  Unless lower respiratory symptoms recur, we will not treat or evaluate further. 

## 2017-11-02 ENCOUNTER — Ambulatory Visit (INDEPENDENT_AMBULATORY_CARE_PROVIDER_SITE_OTHER): Payer: BLUE CROSS/BLUE SHIELD | Admitting: *Deleted

## 2017-11-02 DIAGNOSIS — J309 Allergic rhinitis, unspecified: Secondary | ICD-10-CM

## 2017-11-05 ENCOUNTER — Encounter: Payer: Self-pay | Admitting: *Deleted

## 2017-11-10 DIAGNOSIS — J3089 Other allergic rhinitis: Secondary | ICD-10-CM

## 2017-12-02 ENCOUNTER — Ambulatory Visit (INDEPENDENT_AMBULATORY_CARE_PROVIDER_SITE_OTHER): Payer: BLUE CROSS/BLUE SHIELD | Admitting: *Deleted

## 2017-12-02 DIAGNOSIS — J309 Allergic rhinitis, unspecified: Secondary | ICD-10-CM | POA: Diagnosis not present

## 2018-01-04 ENCOUNTER — Ambulatory Visit (INDEPENDENT_AMBULATORY_CARE_PROVIDER_SITE_OTHER): Payer: BLUE CROSS/BLUE SHIELD | Admitting: *Deleted

## 2018-01-04 DIAGNOSIS — J309 Allergic rhinitis, unspecified: Secondary | ICD-10-CM

## 2018-02-03 ENCOUNTER — Ambulatory Visit (INDEPENDENT_AMBULATORY_CARE_PROVIDER_SITE_OTHER): Payer: BLUE CROSS/BLUE SHIELD | Admitting: *Deleted

## 2018-02-03 DIAGNOSIS — J309 Allergic rhinitis, unspecified: Secondary | ICD-10-CM

## 2018-02-10 ENCOUNTER — Ambulatory Visit (INDEPENDENT_AMBULATORY_CARE_PROVIDER_SITE_OTHER): Payer: BLUE CROSS/BLUE SHIELD | Admitting: *Deleted

## 2018-02-10 DIAGNOSIS — J309 Allergic rhinitis, unspecified: Secondary | ICD-10-CM | POA: Diagnosis not present

## 2018-02-17 ENCOUNTER — Ambulatory Visit (INDEPENDENT_AMBULATORY_CARE_PROVIDER_SITE_OTHER): Payer: BLUE CROSS/BLUE SHIELD | Admitting: *Deleted

## 2018-02-17 DIAGNOSIS — J309 Allergic rhinitis, unspecified: Secondary | ICD-10-CM

## 2018-02-23 ENCOUNTER — Ambulatory Visit (INDEPENDENT_AMBULATORY_CARE_PROVIDER_SITE_OTHER): Payer: BLUE CROSS/BLUE SHIELD | Admitting: *Deleted

## 2018-02-23 DIAGNOSIS — J309 Allergic rhinitis, unspecified: Secondary | ICD-10-CM

## 2018-03-08 ENCOUNTER — Ambulatory Visit (INDEPENDENT_AMBULATORY_CARE_PROVIDER_SITE_OTHER): Payer: BLUE CROSS/BLUE SHIELD | Admitting: *Deleted

## 2018-03-08 DIAGNOSIS — J309 Allergic rhinitis, unspecified: Secondary | ICD-10-CM | POA: Diagnosis not present

## 2018-04-07 ENCOUNTER — Ambulatory Visit (INDEPENDENT_AMBULATORY_CARE_PROVIDER_SITE_OTHER): Payer: BLUE CROSS/BLUE SHIELD | Admitting: *Deleted

## 2018-04-07 DIAGNOSIS — J309 Allergic rhinitis, unspecified: Secondary | ICD-10-CM | POA: Diagnosis not present

## 2018-05-06 ENCOUNTER — Ambulatory Visit (INDEPENDENT_AMBULATORY_CARE_PROVIDER_SITE_OTHER): Payer: BLUE CROSS/BLUE SHIELD

## 2018-05-06 DIAGNOSIS — J309 Allergic rhinitis, unspecified: Secondary | ICD-10-CM

## 2018-06-09 ENCOUNTER — Ambulatory Visit (INDEPENDENT_AMBULATORY_CARE_PROVIDER_SITE_OTHER): Payer: BLUE CROSS/BLUE SHIELD | Admitting: *Deleted

## 2018-06-09 DIAGNOSIS — J309 Allergic rhinitis, unspecified: Secondary | ICD-10-CM

## 2018-07-14 ENCOUNTER — Ambulatory Visit (INDEPENDENT_AMBULATORY_CARE_PROVIDER_SITE_OTHER): Payer: BLUE CROSS/BLUE SHIELD | Admitting: *Deleted

## 2018-07-14 DIAGNOSIS — J309 Allergic rhinitis, unspecified: Secondary | ICD-10-CM

## 2018-08-10 NOTE — Progress Notes (Signed)
VIALS EXP 08-10-2019 

## 2018-08-11 DIAGNOSIS — J3089 Other allergic rhinitis: Secondary | ICD-10-CM

## 2018-08-24 ENCOUNTER — Ambulatory Visit (INDEPENDENT_AMBULATORY_CARE_PROVIDER_SITE_OTHER): Payer: BLUE CROSS/BLUE SHIELD | Admitting: *Deleted

## 2018-08-24 DIAGNOSIS — J309 Allergic rhinitis, unspecified: Secondary | ICD-10-CM

## 2018-10-04 ENCOUNTER — Ambulatory Visit (INDEPENDENT_AMBULATORY_CARE_PROVIDER_SITE_OTHER): Payer: BLUE CROSS/BLUE SHIELD | Admitting: *Deleted

## 2018-10-04 DIAGNOSIS — J309 Allergic rhinitis, unspecified: Secondary | ICD-10-CM

## 2018-10-31 ENCOUNTER — Ambulatory Visit: Payer: BLUE CROSS/BLUE SHIELD | Admitting: Allergy and Immunology

## 2018-11-07 ENCOUNTER — Telehealth: Payer: Self-pay | Admitting: *Deleted

## 2018-11-07 ENCOUNTER — Ambulatory Visit: Payer: BC Managed Care – PPO | Admitting: Allergy and Immunology

## 2018-11-07 ENCOUNTER — Other Ambulatory Visit: Payer: Self-pay

## 2018-11-07 ENCOUNTER — Encounter: Payer: Self-pay | Admitting: Allergy and Immunology

## 2018-11-07 VITALS — BP 128/82 | HR 72 | Temp 98.0°F | Resp 16 | Ht 64.0 in | Wt 134.6 lb

## 2018-11-07 DIAGNOSIS — S40262D Insect bite (nonvenomous) of left shoulder, subsequent encounter: Secondary | ICD-10-CM

## 2018-11-07 DIAGNOSIS — J3089 Other allergic rhinitis: Secondary | ICD-10-CM | POA: Diagnosis not present

## 2018-11-07 DIAGNOSIS — W57XXXD Bitten or stung by nonvenomous insect and other nonvenomous arthropods, subsequent encounter: Secondary | ICD-10-CM | POA: Diagnosis not present

## 2018-11-07 DIAGNOSIS — W57XXXA Bitten or stung by nonvenomous insect and other nonvenomous arthropods, initial encounter: Secondary | ICD-10-CM | POA: Insufficient documentation

## 2018-11-07 DIAGNOSIS — J320 Chronic maxillary sinusitis: Secondary | ICD-10-CM | POA: Diagnosis not present

## 2018-11-07 NOTE — Telephone Encounter (Signed)
Please tell the patient that I have revised her after visit summary and a copy will be mailed to her, including the information regarding Skeeter syndrome.  Please apologize on my behalf for having not provided her with that information before she left the office. Thank you.

## 2018-11-07 NOTE — Assessment & Plan Note (Signed)
Kirsten Huynh's history suggests Skeeter Syndrome.   Information regarding Skeeter Syndrome has been discussed.  Recommedations have been provided regarding mosquito avoidance and early treatment with ice, antihistamines, topical corticosteroids and antiinflammatories.

## 2018-11-07 NOTE — Telephone Encounter (Signed)
Patient states that she spoke with Dr.Bobbitt this morning about mosquitos and that he had some recommendations on how to help when she gets a bite.. She is wondering if he could call her back with that info or send a letter in the mail.

## 2018-11-07 NOTE — Assessment & Plan Note (Signed)
   We will discontinue immunotherapy injections at this time.  Continue appropriate allergen avoidance measures and ipratropium 0.06% nasal spray every 8 hours if needed.  Nasal saline spray (i.e., Simply Saline) or nasal saline lavage (i.e., NeilMed) is recommended as needed and prior to medicated nasal sprays.

## 2018-11-07 NOTE — Patient Instructions (Addendum)
Allergic/nonallergic rhinitis  We will discontinue immunotherapy injections at this time.  Continue appropriate allergen avoidance measures and ipratropium 0.06% nasal spray every 8 hours if needed.  Nasal saline spray (i.e., Simply Saline) or nasal saline lavage (i.e., NeilMed) is recommended as needed and prior to medicated nasal sprays.  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   Information regarding Skeeter Syndrome has been discussed.  Recommedations have been provided regarding mosquito avoidance and early treatment with ice, antihistamines, topical corticosteroids and antiinflammatories.   Return if symptoms worsen or fail to improve.  Skeeter Syndrome Treatment   Mosquito avoidance (see information below)  Ice affected area  Oral antihistamine (Benadryl or Zyrtec)  Oral anti-inflammatory (ibuprofen)  Topical corticosteroid (Hydrocortisone cream 1%)    Strategies for Safer Mosquito Avoidance  by Hale DroneFawn Pattison   Mosquitoes are a terrible nuisance in the muggy summer months, especially now that the ferocious Asian tiger mosquito has made a permanent home here in [place]. The arrival of [place] Nile virus has added some urgency to mosquito control measures, but spray programs and many repellents may do more harm than good in the long term. Choosing the least-toxic solutions can protect both your health and comfort in mosquito season. Here are some suggestions for safer and more effective bite avoidance this summer.   Population Control  Keeping mosquito populations in check is the most important way to avoid bites. It's no secret that removing sources of standing water is crucial to eliminating mosquito breeding grounds. Common breeding sites to watch for include:  * Rain gutters. Clean them out and offer to do the same for elderly neighbors or others who may not be able to do the job themselves. Remember that mosquito control is a community-wide  effort.  * Flowerpots, buckets and old tires. Be sure empty containers cannot hold water.  * Bird baths and pet dishes. Empty and clean them weekly.  * Recycling bins and the cans inside. These may harbor stagnant water if not emptied regularly.  * Rain barrels. Be sure they are sealed off from mosquitoes.  * Storm drains. Watch for clogs from branches and garbage.  Insecticide sprays targeting adult mosquitoes can only reduce mosquito populations for a day or two. In fact, since insecticides also kill off important mosquito predators such as dragonflies, a spray program can actually be counter-productive by leaving the rebounding mosquito population without natural enemies.  Instead, interrupt the breeding cycle by using the nontoxic bacterial larvicide Bacillus thuringiensis var. israelensis (Bti). Bti is sold in convenient donuts called "mosquito dunks" that you can safely use in your bird bath, rain barrel or low areas around your yard to kill mosquito larvae before the adults emerge and spread throughout the community, where they become much harder to kill. Bti is not harmful to fish, birds or mammals, and single applications can remain effective for a month or more, even if the water source dries out and refills.   Safer Repellents  If you'll be outdoors at dawn or dusk when mosquitoes are most active, wear long clothes that don't leave skin exposed. (You may use insect repellent on your clothes). When you do get bites, soothe them by slathering on an astringent such as witch hazel after you come inside - it will prevent scratching and allow bites to heal quickly.  Lately many public health officials concerned about [place] Nile virus have been advising people to use repellents containing the pesticide DEET (N,N-diethyl-meta-toluamide). While DEET is an extremely effective mosquito  repellent, it is also a neurotoxin, and studies have shown that prolonged frequent exposure can irritate skin, cause  muscle twitching and weakness and harm the brain and nervous system, especially when combined with other pesticides such as permethrin.  Consumer studies report that [employer]'s Skin-So-Soft and herbal repellents containing citronella can be just as effective as DEET at repelling mosquitoes but need to be applied more often. The solution is to choose the safer formulas and reapply as needed.  General guidelines for using any insect repellent:  * Choose oils or lotions rather than sprays, which produce fine particles that are easily inhaled.  * Do not apply repellents to broken skin.  * Do not allow children to apply their own repellent, and do not apply repellents containing DEET or other pesticides directly to children's skin. If you use such products, they can be applied to children's clothing instead.  * Do not use sunscreen/repellent combinations. Sunscreen needs to be reapplied more often than repellents, so the combination products can result in overexposure to pesticides.  * Wash off all repellent from skin and clothing immediately after coming indoors.  Area-wide repellent strategies can also be effective for outdoor gatherings. There are various contraptions available that emit carbon dioxide to trap mosquitoes (such as the Mosquito Magnet and Mosquito Deleto). These are expensive, but they do work, and some companies will even rent them to you for an outdoor event. Citronella candles are also effective when there is no breeze, but beware of candles containing pesticides - the smoke is easily inhaled and can irritate the airway. Placing fans around your porch or patio can blow mosquitoes away.  Keep in mind that only female mosquitoes actually bite and that most mosquito species in this area do not transmit West Nile virus. You are most at risk of being bitten by a mosquito carrying the disease at dawn and dusk, and even in these cases your chances of actually contracting the virus are extremely low. So  take sensible steps to keep the buggers under control, but also keep them in perspective as the annoyances they are.

## 2018-11-07 NOTE — Progress Notes (Signed)
Follow-up Note  RE: Kirsten Huynh MRN: [id number] DOB: 03/16/1956 Date of Office Visit: 11/07/2018  Primary care provider: Leone Haven, MD Referring provider: Leone Haven, MD  History of present illness: Kirsten Huynh is a 63 y.o. 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  She has been on immunotherapy injections for 5 years but still experiences chronic rhinosinusitis.  She has seen multiple otolaryngologist and has undergone at least 2 sinus procedures.  She does not believe that she has experienced significant relief from immunotherapy injections.  She has discontinued the budesonide/saline rinses because of difficulty using the rinse with her nasal congestion.  She does use ipratropium nasal spray occasionally. She reports that when bitten by mosquitoes she develops large local reactions.  She does not experience generalized urticaria, remote angioedema, cardiopulmonary symptoms, or GI symptoms.  She reports that her mother had a similar problem with mosquito bites.  Assessment and plan: Allergic/nonallergic rhinitis  We will discontinue immunotherapy injections at this time.  Continue appropriate allergen avoidance measures and ipratropium 0.06% nasal spray every 8 hours if needed.  Nasal saline spray (i.e., Simply Saline) or nasal saline lavage (i.e., NeilMed) is recommended as needed and prior to medicated nasal sprays.  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   Information regarding Skeeter Syndrome has been discussed.  Recommedations have been provided regarding mosquito avoidance and early treatment with ice, antihistamines, topical corticosteroids and antiinflammatories.   Diagnostics: Epicutaneous testing: Negative despite a positive histamine control. Intradermal testing: Positive to mold mix #2, cockroach, and dust mite antigen.    Physical examination: Blood pressure 128/82, pulse 72,  temperature 98 F (36.7 C), temperature source Temporal, resp. rate 16, height 5\' 4"  (1.626 m), weight 134 lb 9.6 oz (61.1 kg), SpO2 98 %.  General: Alert, interactive, in no acute distress. HEENT: TM pearly grey bilaterally. Patient refused nasal and OP exam because she was not comfortable removing her mask. Neck: Supple without lymphadenopathy. Lungs: Clear to auscultation without wheezing, rhonchi or rales. CV: Normal S1, S2 without murmurs. Skin: Warm and dry, without lesions or rashes.  The following portions of the patient's history were reviewed and updated as appropriate: allergies, current medications, past family history, past medical history, past social history, past surgical history and problem list.  Allergies as of 11/07/2018      Reactions   Calcium Carbonate Palpitations   Corticosteroids Anaphylaxis   Severe vertebral osteoporosis Bleeding, pus Other reaction(s): Other (See Comments) Bleeding, pus Other reaction(s): Other - See Comments Severe vertebral osteoporosis Other reaction(s): Other (See Comments) Bleeding, pus Other reaction(s): Other - See Comments Severe vertebral osteoporosis Severe vertebral osteoporosis Bleeding, pus Other reaction(s): Other (See Comments) Bleeding, pus Other reaction(s): Other - See Comments Severe vertebral osteoporosis   Fluticasone Anaphylaxis   Bleeding, pus   Sudafed [pseudoephedrine Hcl]       Medication List       Accurate as of November 07, 2018  5:46 PM. If you have any questions, ask your nurse or doctor.        STOP taking these medications   alendronate 70 MG tablet Commonly known as: FOSAMAX Stopped by: Edmonia Lynch, MD   budesonide 0.5 MG/2ML nebulizer solution Commonly known as: PULMICORT Stopped by: Edmonia Lynch, MD   GUAIFENESIN 1200 PO Stopped by: Edmonia Lynch, MD   NASAL SALINE NA Stopped by: Edmonia Lynch, MD     TAKE these medications   COLLAGEN PO Take by mouth.  EPINEPHrine 0.3 mg/0.3 mL Soaj injection Commonly known as: Auvi-Q USE AS DIRECTED FOR LIFE THREATENING ALLERGIC REACTIONS   ipratropium 0.06 % nasal spray Commonly known as: ATROVENT   ketoconazole 2 % cream Commonly known as: NIZORAL   penicillin v potassium 500 MG tablet Commonly known as: VEETID   vitamin C 500 MG tablet Commonly known as: ASCORBIC ACID Take 500 mg by mouth daily.   VITAMIN D PO Take by mouth.       Allergies  Allergen Reactions  . Calcium Carbonate Palpitations  . Corticosteroids Anaphylaxis    Severe vertebral osteoporosis Bleeding, pus Other reaction(s): Other (See Comments) Bleeding, pus Other reaction(s): Other - See Comments Severe vertebral osteoporosis Other reaction(s): Other (See Comments) Bleeding, pus Other reaction(s): Other - See Comments Severe vertebral osteoporosis Severe vertebral osteoporosis Bleeding, pus Other reaction(s): Other (See Comments) Bleeding, pus Other reaction(s): Other - See Comments Severe vertebral osteoporosis   . Fluticasone Anaphylaxis    Bleeding, pus  . Sudafed [Pseudoephedrine Hcl]    Review of systems: Review of systems negative except as noted in HPI / PMHx or noted below: Constitutional: Negative.  HENT: Negative.   Eyes: Negative.  Respiratory: Negative.   Cardiovascular: Negative.  Gastrointestinal: Negative.  Genitourinary: Negative.  Musculoskeletal: Negative.  Neurological: Negative.  Endo/Heme/Allergies: Negative.  Cutaneous: Negative.  Past Medical History:  Diagnosis Date  . Asthma     Family History  Problem Relation Age of Onset  . Asthma Mother   . Allergic rhinitis Mother   . Allergic rhinitis Father   . Asthma Sister   . Allergic rhinitis Sister   . Asthma Brother   . Allergic rhinitis Brother   . Angioedema Neg Hx   . Eczema Neg Hx   . Immunodeficiency Neg Hx   . Urticaria Neg Hx     Social History   Socioeconomic History  . Marital status: Divorced     Spouse name: Not on file  . Number of children: Not on file  . Years of education: Not on file  . Highest education level: Not on file  Occupational History  . Not on file  Social Needs  . Financial resource strain: Not on file  . Food insecurity    Worry: Not on file    Inability: Not on file  . Transportation needs    Medical: Not on file    Non-medical: Not on file  Tobacco Use  . Smoking status: Never Smoker  . Smokeless tobacco: Never Used  Substance and Sexual Activity  . Alcohol use: No  . Drug use: No  . Sexual activity: Not on file  Lifestyle  . Physical activity    Days per week: Not on file    Minutes per session: Not on file  . Stress: Not on file  Relationships  . Social Musicianconnections    Talks on phone: Not on file    Gets together: Not on file    Attends religious service: Not on file    Active member of club or organization: Not on file    Attends meetings of clubs or organizations: Not on file    Relationship status: Not on file  . Intimate partner violence    Fear of current or ex partner: Not on file    Emotionally abused: Not on file    Physically abused: Not on file    Forced sexual activity: Not on file  Other Topics Concern  . Not on file  Social History Narrative  .  Not on file    I appreciate the opportunity to take part in Kirsten Huynh's care. Please do not hesitate to contact me with questions.  Sincerely,   R. Jorene Guestarter Kirsten Fodor, MD

## 2018-11-08 IMAGING — CT CT MAXILLOFACIAL W/O CM
3 series · 15 of 47 positions shown, 18 images · non-contrast
Comparison: None.

CLINICAL DATA: Sinus pain and pressure. Difficulty breathing.
History of sinus surgery.

EXAM:
CT MAXILLOFACIAL WITHOUT CONTRAST
TECHNIQUE: Multidetector CT imaging of the maxillofacial structures was
performed. Multiplanar CT image reconstructions were also generated.

[Series 4: sinus 2.0 h30s · axial · 0.36mm/px · z∈[-212,-42]mm · 9 of 99 slices shown, 12 images]
[im 7/99  brain]
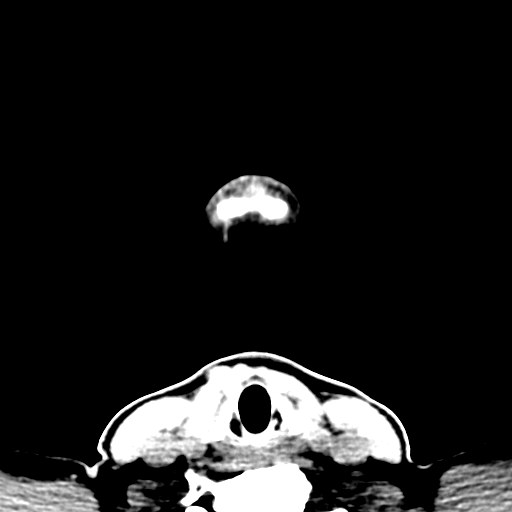
[im 7/99  bone]
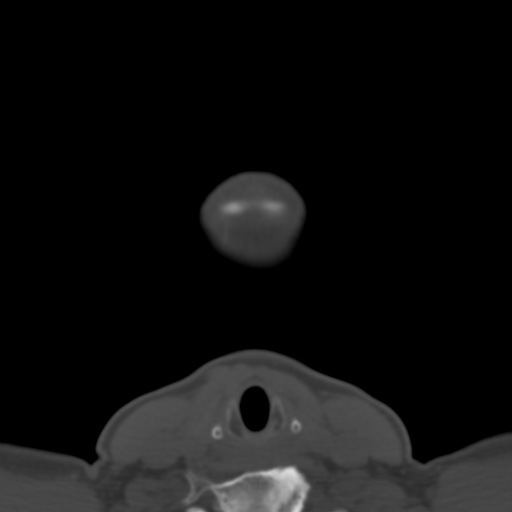
[im 17/99  bone]
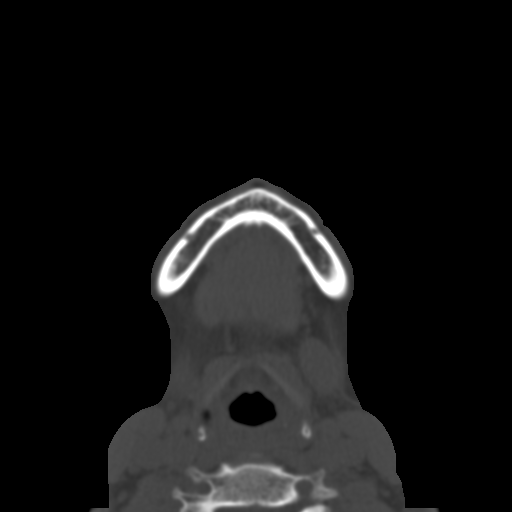
[im 28/99  bone]
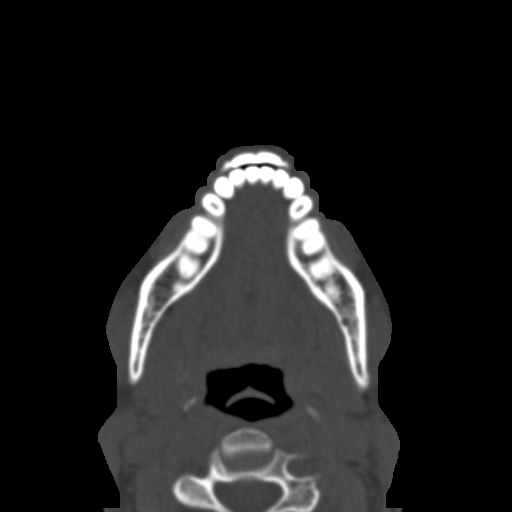
[im 38/99  bone]
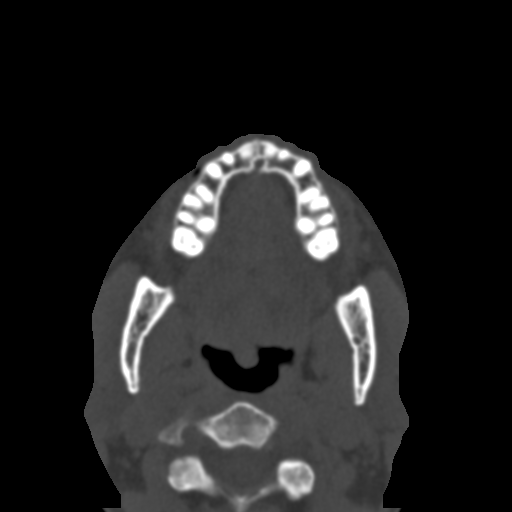
[im 51/99  brain]
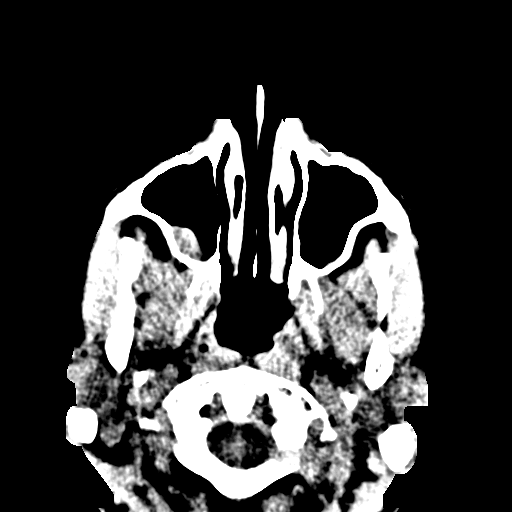
[im 51/99  bone]
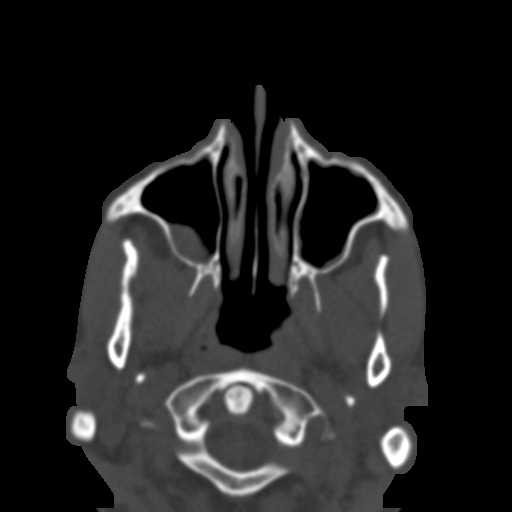
[im 61/99  bone]
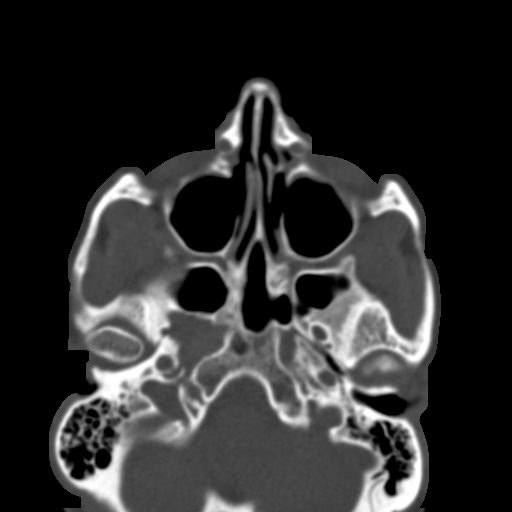
[im 71/99  bone]
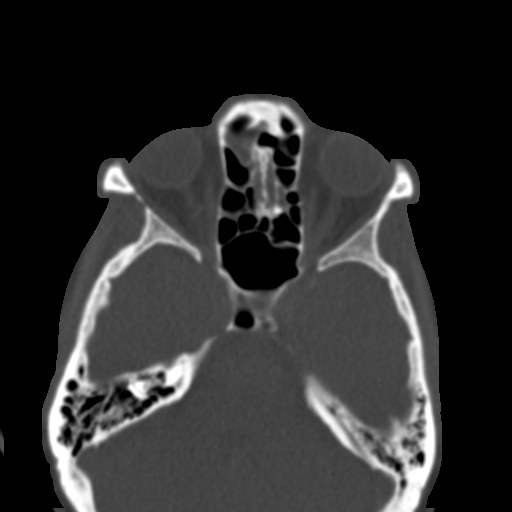
[im 82/99  bone]
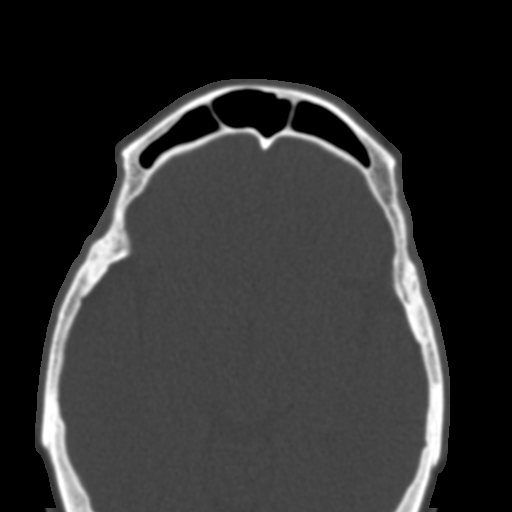
[im 92/99  brain]
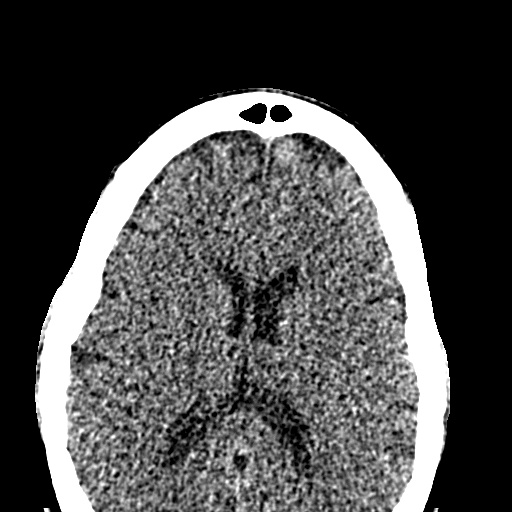
[im 92/99  bone]
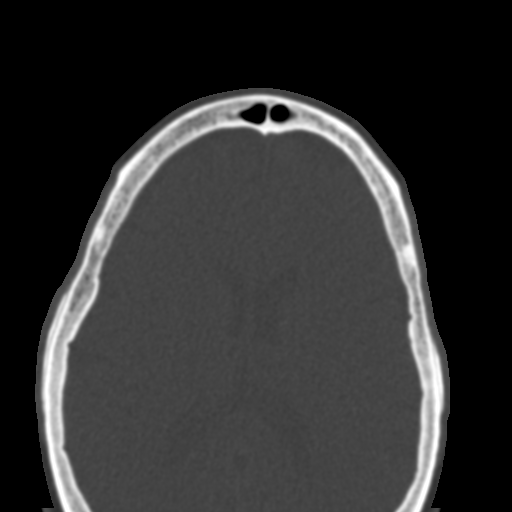

[Series 7: sinus 2.0 mpr cor · coronal · 0.33mm/px · 3 of 87 slices shown]
[im 29/87  bone]
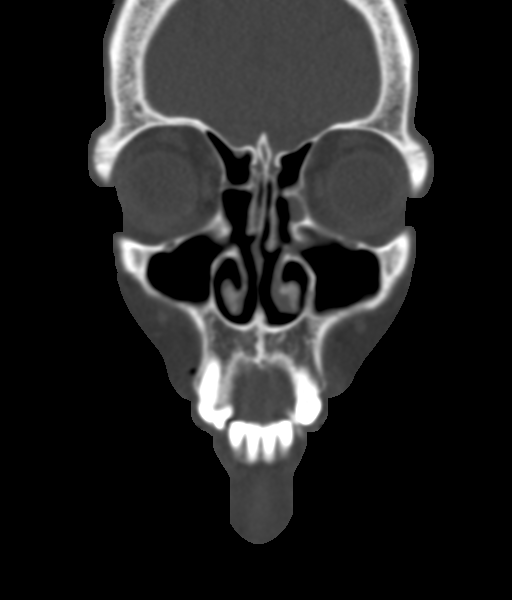
[im 39/87  bone]
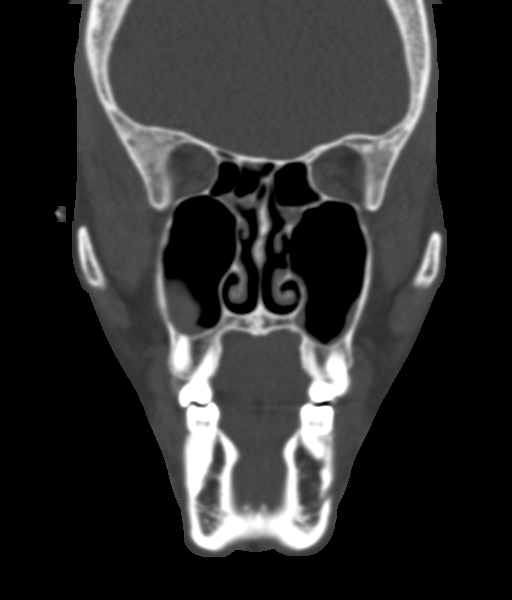
[im 48/87  bone]
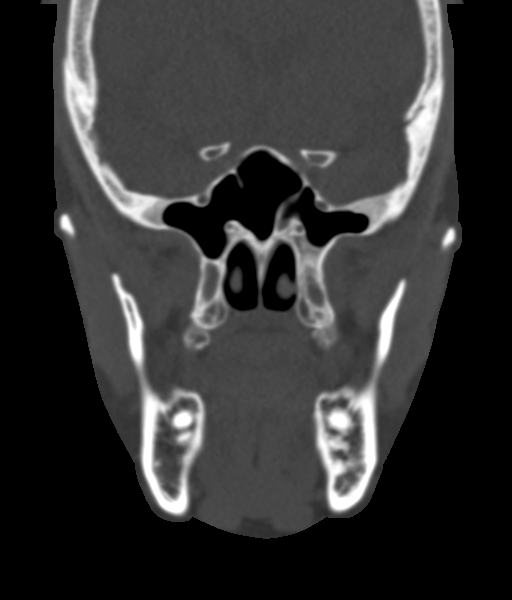

[Series 8: sinus 2.0 mpr sag · sagittal · 0.32mm/px · 3 of 90 slices shown]
[im 30/90  bone]
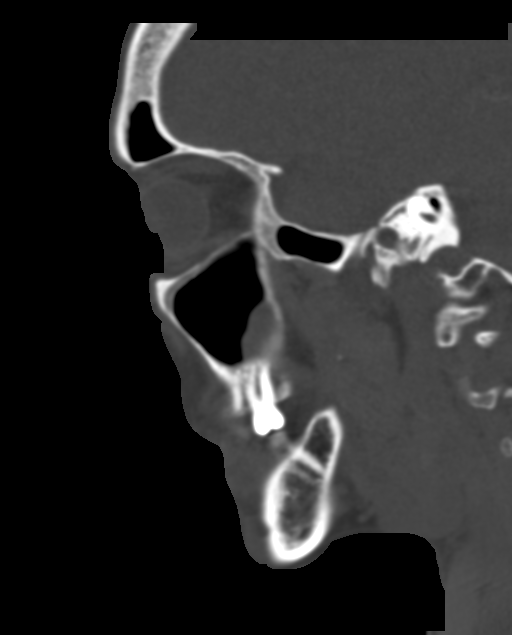
[im 45/90  bone]
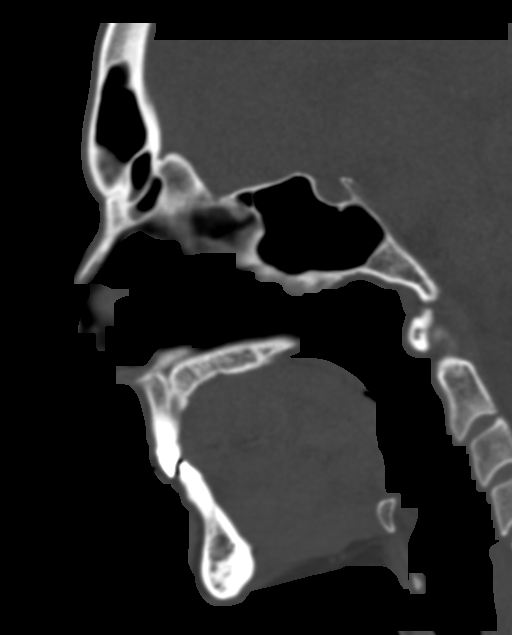
[im 60/90  bone]
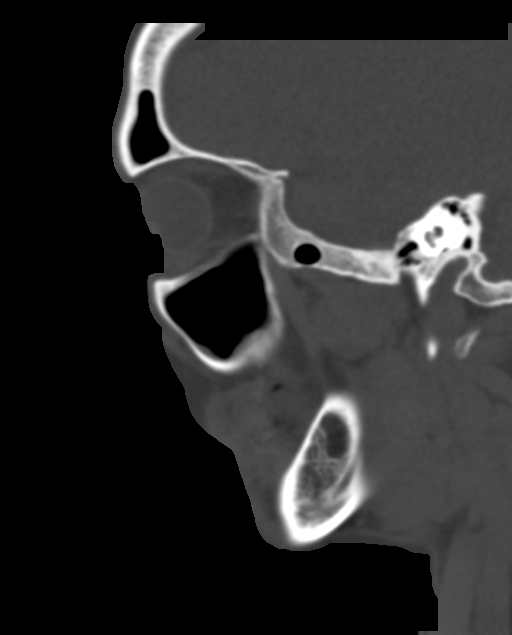

[15 of 47 positions shown; findings below may reference images not displayed]

FINDINGS: SINUSES: Mild bilateral maxillary sinus mucosal thickening. 2 cm
RIGHT maxillary mucosal retention cyst. Sphenoid sinuses are well
aerated. Mild ethmoid and RIGHT frontal sinus mucosal thickening.
Pneumatized sphenoid lateral recesses.

OSTIA: Status post bilateral antrectomies/on cecectomy use and
partial turbinectomies. Patent bilateral maxillary antra, LEFT
sphenoethmoidal and LEFT frontal recess. Soft tissue opacification
RIGHT frontal recess. Stenotic RIGHT sphenoethmoidal recess.

FACIAL BONES: No acute facial fracture. No destructive bony lesions.

ORBITS: Ocular globes and orbital contents are unremarkable.

SOFT TISSUES: No significant soft tissue swelling. No subcutaneous
gas or radiopaque foreign bodies. Included intracranial contents are
normal.
IMPRESSION: 1. Mild paranasal sinus mucosal thickening, status post FESS.
2. Effaced RIGHT frontal recess. Stenotic RIGHT sphenoethmoidal
recess.

## 2018-11-08 NOTE — Telephone Encounter (Signed)
I gave the message to patient about mailing out notes to her.

## 2018-11-09 ENCOUNTER — Telehealth: Payer: Self-pay | Admitting: Allergy and Immunology

## 2018-11-09 NOTE — Telephone Encounter (Signed)
She is having a knot where molds where done. Insured her that after the first 20 mins of reading all results are invalid and that it is not uncommon for the mold ids to linger a couple days longer. She can use otc benadryl/ hydrocortisone cream, oral antihistamine and cold compress. If not any better by Friday she will call back.

## 2018-11-09 NOTE — Telephone Encounter (Signed)
Spoke with pt- we did all 15 ids on monday

## 2018-11-09 NOTE — Telephone Encounter (Signed)
Pt called and has a question about the id that was done she has a red knot place on arm. [phone]

## 2021-07-02 NOTE — Progress Notes (Signed)
? ?Follow Up Note ? ?RE: Kirsten Huynh MRN: [id number] DOB: 11-Apr-1955 ?Date of Office Visit: 07/03/2021 ? ?Referring provider: Gaspar Skeeters, MD ?Primary care provider: Gaspar Skeeters, MD ? ?Chief Complaint: Allergic Rhinitis  (Mold in apartment causing allergies issues) ? ?History of Present Illness: ?I had the pleasure of seeing Kirsten Huynh for a follow up visit at the [facility] on 07/04/2021. She is a 66 y.o. female, who is being followed for allergic rhinitis. Her previous allergy office visit was on 11/07/2018 with Dr. Nunzio Cobbs. Today is a skin testing and follow up visit. ? ?She reports symptoms of nasal congestion, PND, sneezing, rhinorrhea, itchy eyes. Symptoms have been going on for many years. The symptoms are present all year around. Anosmia: sometimes. Headache: no. She has used allegra, Claritin with minimal improvement in symptoms.  ?Patient is unable to do nasal sprays or sinus rinses as things get "stuck" around her sinuses. ?Zyrtec causes drowsiness.  ? ?Sinus infections: 2. Previous work up includes: 2020 intradermal skin testing was positive to perennial mold #2, cockroach and dust mites. ?Patient was on AIT (dust mites and cockroaches) for about 5 years and unsure if it helped.  ?Previous ENT evaluation: yes and had sinus surgery in the past - turbinoplasty and septoplasty. ?Previous sinus imaging: not recently. ?History of nasal polyps: yes in the past ?Last eye exam: once per year. ?History of reflux: sometimes but not on any meds.  ? ?Patient lives in an apartment and concerned about possible mold in there. She has been living in there for 15 years.  ? ?Assessment and Plan: ?Oriyah is a 66 y.o. female with: ?Allergic/nonallergic rhinitis ?Past history - on AIT for [employer] for many years, stopped in 2020. Had sinus surgery. ?Interim history - concerned about mold in apartment contributing to her current sins symptoms. ?Today's skin testing showed: Positive to dust mites  and cockroaches. Negative to mold.  ?Recommend ENT follow up.  ?Start environmental control measures as below. ?Use over the counter antihistamines such as Zyrtec (cetirizine), Claritin (loratadine), Allegra (fexofenadine), or Xyzal (levocetirizine) daily as needed. May take twice a day during allergy flares. May switch antihistamines every few months. ?Start Ryaltris (olopatadine + mometasone nasal spray combination) 1-2 sprays per nostril twice a day. Sample given. ?If it works well let me know and I will send in a prescription.  ?Nasal saline spray (i.e., Simply Saline) is recommended as needed and prior to medicated nasal sprays. ?Consider oral immunotherapy for dust mites. ?Read about Isaiah Serge.  ?May take mucinex twice a day as needed and take with plenty of water. ?Use olopatadine eye drops 0.7% once a day as needed for itchy/watery eyes. Sample given.  ? ?Allergic conjunctivitis of both eyes ?See assessment and plan as above. ? ?Shortness of breath ?Some increased mucous production and shortness of breath.  ?Today's spirometry showed: possible restrictive disease with 4% improvement in FEV1 post bronchodilator treatment. Clinically feeling unchanged.  ?Advised to take mucinex as above. ?Monitor symptoms.  ? ?Return in about 3 months (around 10/02/2021). ? ?No orders of the defined types were placed in this encounter. ? ?Lab Orders  ?No laboratory test(s) ordered today  ? ? ?Diagnostics: ?Spirometry:  ?Tracings reviewed. Her effort: Good reproducible efforts. ?FVC: 2.92L ?FEV1: 2.19L, 68% predicted ?FEV1/FVC ratio: 75% ?Interpretation: Spirometry consistent with possible restrictive disease with 4% improvement in FEV1 post bronchodilator treatment. Clinically feeling unchanged.  ? ?Please see scanned spirometry results for details. ? ?Skin Testing: Environmental allergy panel. ?  Positive to dust mites and cockroaches. ?Negative to mold.  ?Results discussed with patient/family. ? Airborne Adult Perc - 07/03/21 1507    ? ? Time Antigen Placed 1510   ? Allergen Manufacturer Waynette Buttery   ? Location Back   ? Number of Test 59   ? Panel 1 Select   ? 1. Control-Buffer 50% Glycerol Negative   ? 2. Control-Histamine 1 mg/ml 2+   ? 3. Albumin saline Negative   ? 4. Bahia Negative   ? 5. [place] Negative   ? 6. Johnson Negative   ? 7. Kentucky Blue Negative   ? 8. Meadow Fescue Negative   ? 9. Perennial Rye Negative   ? 10. Sweet Vernal Negative   ? 11. Timothy Negative   ? 12. Cocklebur Negative   ? 13. Burweed Marshelder Negative   ? 14. Ragweed, short Negative   ? 15. Ragweed, Giant Negative   ? 16. Plantain,  English Negative   ? 17. Lamb's Quarters Negative   ? 18. Sheep Sorrell Negative   ? 19. Rough Pigweed Negative   ? 20. Marsh Elder, Rough Negative   ? 21. Mugwort, Common Negative   ? 22. Ash mix Negative   ? 23. Charletta Cousin mix Negative   ? 24. Beech American Negative   ? 25. Box, Elder Negative   ? 26. Cedar, red Negative   ? 27. Cottonwood, [place] Negative   ? 28. Elm mix Negative   ? 29. Hickory Negative   ? 30. Maple mix Negative   ? 31. Oak, [place] mix Negative   ? 32. Pecan Pollen Negative   ? 33. Pine mix Negative   ? 34. Sycamore Eastern Negative   ? 35. Walnut, Black Pollen Negative   ? 36. Alternaria alternata Negative   ? 37. Cladosporium Herbarum Negative   ? 38. Aspergillus mix Negative   ? 39. Penicillium mix Negative   ? 40. Bipolaris sorokiniana (Helminthosporium) Negative   ? 41. Drechslera spicifera (Curvularia) Negative   ? 42. Mucor plumbeus Negative   ? 43. Fusarium moniliforme Negative   ? 44. Aureobasidium pullulans (pullulara) Negative   ? 45. Rhizopus oryzae Negative   ? 46. Botrytis cinera Negative   ? 47. Epicoccum nigrum Negative   ? 48. Phoma betae Negative   ? 49. Candida Albicans Negative   ? 50. Trichophyton mentagrophytes Negative   ? 51. Mite, D Farinae  5,000 AU/ml Negative   ? 52. Mite, D Pteronyssinus  5,000 AU/ml Negative   ? 53. Cat Hair 10,000 BAU/ml Negative   ? 54.  Dog Epithelia Negative   ?  55. Mixed Feathers Negative   ? 56. Horse Epithelia Negative   ? 57. Cockroach, [place] Negative   ? 58. Mouse Negative   ? 59. Tobacco Leaf Negative   ? ?  ?  ? ?  ? ? Intradermal - 07/03/21 1533   ? ? Time Antigen Placed 1533   ? Allergen Manufacturer Waynette Buttery   ? Location Arm   ? Number of Test 15   ? Control Negative   ? [place] Negative   ? Johnson Negative   ? 7 Grass Negative   ? Ragweed mix Negative   ? Weed mix Negative   ? Tree mix Negative   ? Mold 1 Negative   ? Mold 2 Negative   ? Mold 3 Negative   ? Mold 4 Negative   ? Cat Negative   ? Dog Negative   ? Cockroach  2+   ? Mite mix 2+   ? Other Omitted   ? ?  ?  ? ?  ? ? ?Medication List:  ?Current Outpatient Medications  ?Medication Sig Dispense Refill  ? vitamin C (ASCORBIC ACID) 500 MG tablet Take 500 mg by mouth daily.    ? VITAMIN D PO Take by mouth.    ? ?No current facility-administered medications for this visit.  ? ?Allergies: ?Allergies  ?Allergen Reactions  ? Calcium Carbonate Palpitations  ? Corticosteroids Anaphylaxis  ?  Severe vertebral osteoporosis ?Bleeding, pus ?Other reaction(s): Other (See Comments) ?Bleeding, pus ?Other reaction(s): Other - See Comments ?Severe vertebral osteoporosis ?Other reaction(s): Other (See Comments) ?Bleeding, pus ?Other reaction(s): Other - See Comments ?Severe vertebral osteoporosis ?Severe vertebral osteoporosis ?Bleeding, pus ?Other reaction(s): Other (See Comments) ?Bleeding, pus ?Other reaction(s): Other - See Comments ?Severe vertebral osteoporosis ?  ? Fluticasone Anaphylaxis  ?  Bleeding, pus  ? Sudafed [Pseudoephedrine Hcl]   ? ?I reviewed her past medical history, social history, family history, and environmental history and no significant changes have been reported from her previous visit. ? ?Review of Systems  ?Constitutional:  Negative for appetite change, chills, fever and unexpected weight change.  ?HENT:  Positive for congestion, postnasal drip, rhinorrhea, sinus pressure and sneezing.   ?Eyes:   Positive for itching.  ?Respiratory:  Negative for cough, chest tightness and wheezing.   ?Gastrointestinal:  Negative for abdominal pain.  ?Skin:  Negative for rash.  ?Allergic/Immunologic: Positive fo

## 2021-07-03 ENCOUNTER — Encounter: Payer: Self-pay | Admitting: Allergy

## 2021-07-03 ENCOUNTER — Ambulatory Visit: Payer: BC Managed Care – PPO | Admitting: Allergy

## 2021-07-03 VITALS — BP 100/60 | HR 66 | Temp 98.3°F | Resp 16 | Ht 65.5 in | Wt 141.0 lb

## 2021-07-03 DIAGNOSIS — R0602 Shortness of breath: Secondary | ICD-10-CM

## 2021-07-03 DIAGNOSIS — J3089 Other allergic rhinitis: Secondary | ICD-10-CM

## 2021-07-03 DIAGNOSIS — H1013 Acute atopic conjunctivitis, bilateral: Secondary | ICD-10-CM | POA: Diagnosis not present

## 2021-07-03 NOTE — Patient Instructions (Addendum)
Today's skin testing showed: ?Positive to dust mites and cockroaches. ?Negative to mold.  ? ?Results given. ? ?Environmental allergies ?Make appointment with ENT. ?Start environmental control measures as below. ?Use over the counter antihistamines such as Zyrtec (cetirizine), Claritin (loratadine), Allegra (fexofenadine), or Xyzal (levocetirizine) daily as needed. May take twice a day during allergy flares. May switch antihistamines every few months. ?Start Ryaltris (olopatadine + mometasone nasal spray combination) 1-2 sprays per nostril twice a day. Sample given. ?If it works well let me know and I will send in a prescription.  ?Nasal saline spray (i.e., Simply Saline) is recommended as needed and prior to medicated nasal sprays. ?Consider oral immunotherapy for dust mites. ?Read about Kathlene November.  ?May take mucinex twice a day as needed and take with plenty of water. ?Use olopatadine eye drops 0.7% once a day as needed for itchy/watery eyes. Sample given.  ? ?Follow up in 3 months or sooner if needed.   ? ?Control of House Dust Mite Allergen ?Dust mite allergens are a common trigger of allergy and asthma symptoms. While they can be found throughout the house, these microscopic creatures thrive in warm, humid environments such as bedding, upholstered furniture and carpeting. ?Because so much time is spent in the bedroom, it is essential to reduce mite levels there.  ?Encase pillows, mattresses, and box springs in special allergen-proof fabric covers or airtight, zippered plastic covers.  ?Bedding should be washed weekly in hot water (130? F) and dried in a hot dryer. Allergen-proof covers are available for comforters and pillows that can?t be regularly washed.  ?Wash the allergy-proof covers every few months. Minimize clutter in the bedroom. Keep pets out of the bedroom.  ?Keep humidity less than 50% by using a dehumidifier or air conditioning. You can buy a humidity measuring device called a hygrometer to monitor  this.  ?If possible, replace carpets with hardwood, linoleum, or washable area rugs. If that's not possible, vacuum frequently with a vacuum that has a HEPA filter. ?Remove all upholstered furniture and non-washable window drapes from the bedroom. ?Remove all non-washable stuffed toys from the bedroom.  Wash stuffed toys weekly. ?Cockroach Allergen Avoidance ?Cockroaches are often found in the homes of densely populated urban areas, schools or commercial buildings, but these creatures can lurk almost anywhere. This does not mean that you have a dirty house or living area. ?Block all areas where roaches can enter the home. This includes crevices, wall cracks and windows.  ?Cockroaches need water to survive, so fix and seal all leaky faucets and pipes. Have an exterminator go through the house when your family and pets are gone to eliminate any remaining roaches. ?Keep food in lidded containers and put pet food dishes away after your pets are done eating. Vacuum and sweep the floor after meals, and take out garbage and recyclables. Use lidded garbage containers in the kitchen. Wash dishes immediately after use and clean under stoves, refrigerators or toasters where crumbs can accumulate. Wipe off the stove and other kitchen surfaces and cupboards regularly. ? ? ?Mold Control ?Mold and fungi can grow on a variety of surfaces provided certain temperature and moisture conditions exist.  ?Outdoor molds grow on plants, decaying vegetation and soil. The major outdoor mold, Alternaria and Cladosporium, are found in very high numbers during hot and dry conditions. Generally, a late summer - fall peak is seen for common outdoor fungal spores. Rain will temporarily lower outdoor mold spore count, but counts rise rapidly when the rainy period ends. ?The most  important indoor molds are Aspergillus and Penicillium. Dark, humid and poorly ventilated basements are ideal sites for mold growth. The next most common sites of mold  growth are the bathroom and the kitchen. ?Outdoor (Seasonal) Mold Control ?Use air conditioning and keep windows closed. ?Avoid exposure to decaying vegetation. ?Avoid leaf raking. ?Avoid grain handling. ?Consider wearing a face mask if working in moldy areas.  ?Indoor (Perennial) Mold Control  ?Maintain humidity below 50%. ?Get rid of mold growth on hard surfaces with water, detergent and, if necessary, 5% bleach (do not mix with other cleaners). Then dry the area completely. If mold covers an area more than 10 square feet, consider hiring an indoor environmental professional. ?For clothing, washing with soap and water is best. If moldy items cannot be cleaned and dried, throw them away. ?Remove sources e.g. contaminated carpets. ?Repair and seal leaking roofs or pipes. Using dehumidifiers in damp basements may be helpful, but empty the water and clean units regularly to prevent mildew from forming. All rooms, especially basements, bathrooms and kitchens, require ventilation and cleaning to deter mold and mildew growth. Avoid carpeting on concrete or damp floors, and storing items in damp areas. ? ?

## 2021-07-04 ENCOUNTER — Encounter: Payer: Self-pay | Admitting: Allergy

## 2021-07-04 DIAGNOSIS — R0602 Shortness of breath: Secondary | ICD-10-CM | POA: Insufficient documentation

## 2021-07-04 DIAGNOSIS — H1013 Acute atopic conjunctivitis, bilateral: Secondary | ICD-10-CM | POA: Insufficient documentation

## 2021-07-04 NOTE — Assessment & Plan Note (Signed)
Some increased mucous production and shortness of breath.  ?? Today's spirometry showed: possible restrictive disease with 4% improvement in FEV1 post bronchodilator treatment. Clinically feeling unchanged.  ?? Advised to take mucinex as above. ?? Monitor symptoms.  ?

## 2021-07-04 NOTE — Assessment & Plan Note (Signed)
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 Had sinus surgery. ?Interim history - concerned about mold in apartment contributing to her current sins symptoms. ?? Today's skin testing showed: Positive to dust mites and cockroaches. Negative to mold.  ?? Recommend ENT follow up.  ?? Start environmental control measures as below. ?? Use over the counter antihistamines such as Zyrtec (cetirizine), Claritin (loratadine), Allegra (fexofenadine), or Xyzal (levocetirizine) daily as needed. May take twice a day during allergy flares. May switch antihistamines every few months. ?? Start Ryaltris (olopatadine + mometasone nasal spray combination) 1-2 sprays per nostril twice a day. Sample given. ?? If it works well let me know and I will send in a prescription.  ?? Nasal saline spray (i.e., Simply Saline) is recommended as needed and prior to medicated nasal sprays. ?? Consider oral immunotherapy for dust mites. ?? Read about Isaiah Serge.  ?? May take mucinex twice a day as needed and take with plenty of water. ?? Use olopatadine eye drops 0.7% once a day as needed for itchy/watery eyes. Sample given.  ?

## 2021-07-04 NOTE — Assessment & Plan Note (Signed)
.   See assessment and plan as above.
